# Patient Record
Sex: Female | Born: 2000 | Race: White | Hispanic: No | Marital: Single | Smoking: Never smoker
Health system: Southern US, Community
[De-identification: ages and names within clinical notes are randomized; demographics above are authoritative.]

## PROBLEM LIST (undated history)

## (undated) DIAGNOSIS — D649 Anemia, unspecified: Secondary | ICD-10-CM

## (undated) DIAGNOSIS — N390 Urinary tract infection, site not specified: Secondary | ICD-10-CM

## (undated) DIAGNOSIS — K3580 Unspecified acute appendicitis: Secondary | ICD-10-CM

## (undated) DIAGNOSIS — R569 Unspecified convulsions: Secondary | ICD-10-CM

## (undated) DIAGNOSIS — F909 Attention-deficit hyperactivity disorder, unspecified type: Secondary | ICD-10-CM

## (undated) DIAGNOSIS — F419 Anxiety disorder, unspecified: Secondary | ICD-10-CM

## (undated) DIAGNOSIS — F32A Depression, unspecified: Secondary | ICD-10-CM

## (undated) DIAGNOSIS — F329 Major depressive disorder, single episode, unspecified: Secondary | ICD-10-CM

## (undated) DIAGNOSIS — B2790 Infectious mononucleosis, unspecified without complication: Secondary | ICD-10-CM

## (undated) DIAGNOSIS — K529 Noninfective gastroenteritis and colitis, unspecified: Secondary | ICD-10-CM

## (undated) DIAGNOSIS — N83209 Unspecified ovarian cyst, unspecified side: Secondary | ICD-10-CM

## (undated) DIAGNOSIS — N200 Calculus of kidney: Secondary | ICD-10-CM

## (undated) HISTORY — PX: APPENDECTOMY: SHX54

## (undated) HISTORY — DX: Unspecified acute appendicitis: K35.80

---
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
## 2021-01-17 ENCOUNTER — Encounter (INDEPENDENT_AMBULATORY_CARE_PROVIDER_SITE_OTHER): Payer: Self-pay

## 2021-07-26 ENCOUNTER — Ambulatory Visit (INDEPENDENT_AMBULATORY_CARE_PROVIDER_SITE_OTHER): Payer: Medicaid Other | Admitting: Advanced Practice Midwife

## 2021-07-26 ENCOUNTER — Encounter: Payer: Self-pay | Admitting: Advanced Practice Midwife

## 2021-07-26 ENCOUNTER — Other Ambulatory Visit: Payer: Self-pay

## 2021-07-26 VITALS — BP 100/62 | Ht 63.0 in | Wt 136.0 lb

## 2021-07-26 DIAGNOSIS — N921 Excessive and frequent menstruation with irregular cycle: Secondary | ICD-10-CM | POA: Diagnosis not present

## 2021-07-26 MED ORDER — NORETHINDRONE-ETH ESTRADIOL 1-35 MG-MCG PO TABS: 1.0000 | ORAL_TABLET | Freq: Every day | ORAL | 11 refills | Status: DC

## 2021-07-26 NOTE — Patient Instructions (Signed)
Menorrhagia Menorrhagia is a form of abnormal uterine bleeding in which menstrual periods are heavy or last longer than normal. With menorrhagia, the periods may cause enough blood loss and cramping that a woman becomes unable to take part in herusual activities. What are the causes? Common causes of this condition include: Polyps or fibroids. These are noncancerous growths in the uterus. An imbalance of the hormones estrogen and progesterone. Anovulation, which occurs when one of the ovaries does not release an egg during one or more months. A problem with the thyroid gland (hypothyroidism). Side effects of having an intrauterine device (IUD). Side effects of some medicines, such as NSAIDs or blood thinners. A bleeding disorder that stops the blood from clotting normally. In some cases, the cause of this condition is not known. What increases the risk? You are more likely to develop this condition if you have cancer of the uterus. What are the signs or symptoms? Symptoms of this condition include: Routinely having to change your pad or tampon every 1-2 hours because it is soaked. Needing to use pads and tampons at the same time because of heavy bleeding. Needing to wake up to change your pads or tampons during the night. Passing blood clots larger than 1 inch (2.5 cm) in size. Having bleeding that lasts for more than 7 days. Having symptoms of low iron levels (anemia), such as tiredness (fatigue) or shortness of breath. How is this diagnosed? This condition may be diagnosed based on: A physical exam. Your symptoms and menstrual history. Tests, such as: Blood tests to check if you are pregnant or if you have hormonal changes, a bleeding or thyroid disorder, anemia, or other problems. Pap test to check for cancerous changes, infections, or inflammation. Endometrial biopsy. This test involves removing a tissue sample from the lining of the uterus (endometrium) to be examined under a  microscope. Pelvic ultrasound. This test uses sound waves to create images of your uterus, ovaries, and vagina. The images can show if you have fibroids or other growths. Hysteroscopy. For this test, a thin, flexible tube with a light on the end (hysteroscope) is used to look inside your uterus. How is this treated? Treatment may not be needed for this condition. If it is needed, the best treatment for you will depend on: Whether you need to prevent pregnancy. Your desire to have children in the future. The cause and severity of your bleeding. Your personal preference. Medicine Medicines are the first step in treatment. You may be treated with: Hormonal birth control methods. These treatments reduce bleeding during your menstrual period. They include: Birth control pills. Skin patch. Vaginal ring. Shots (injections) that you get every 3 months. Hormonal IUD. Implants that go under the skin. Medicines that thicken the blood and slow bleeding. Medicines that reduce swelling, such as ibuprofen. Medicines that contain an artificial (synthetic) hormone called progestin. Medicines that make the ovaries stop working for a short time. Iron supplements to treat anemia.  Surgery If medicines do not work, surgery may be done. Surgical options may include: Dilation and curettage (D&C). In this procedure, your health care provider opens the lowest part of the uterus (cervix) and then scrapes or suctions tissue from the endometrium. This reduces menstrual bleeding. Operative hysteroscopy. In this procedure, a hysteroscope is used to view your uterus and help remove polyps that may be causing heavy periods. Endometrial ablation. This is when various techniques are used to permanently destroy your entire endometrium. After endometrial ablation, most women have little or   no menstrual flow. This procedure reduces your ability to become pregnant. Endometrial resection. In this procedure, an electrosurgical  wire loop is used to remove the endometrium. This procedure reduces your ability to become pregnant. Hysterectomy. This is surgical removal of your uterus. This is a permanent procedure that stops menstrual periods. Pregnancy is not possible after a hysterectomy. Follow these instructions at home: Medicines Take over-the-counter and prescription medicines only as told by your health care provider. This includes iron pills. Do not change or switch medicines without asking your health care provider. Do not take aspirin or medicines that contain aspirin 1 week before or during your menstrual period. Aspirin may make bleeding worse. Managing constipation Your iron pills may cause constipation. If you are taking prescription iron supplements, you may need to take these actions to prevent or treat constipation: Drink enough fluid to keep your urine pale yellow. Take over-the-counter or prescription medicines. Eat foods that are high in fiber, such as beans, whole grains, and fresh fruits and vegetables. Limit foods that are high in fat and processed sugars, such as fried or sweet foods. General instructions If you need to change your sanitary pad or tampon more than once every 2 hours, limit your activity until the bleeding stops. Eat well-balanced meals, including foods that are high in iron. Foods that have a lot of iron include leafy green vegetables, meat, liver, eggs, and whole-grain breads and cereals. Do not try to lose weight until the abnormal bleeding has stopped and your blood iron level is back to normal. If you need to lose weight, work with your health care provider to lose weight safely. Keep all follow-up visits. This is important. Contact a health care provider if: You soak through a pad or tampon every 1 or 2 hours, and this happens every time you have a period. You need to use pads and tampons at the same time because you are bleeding so much. You have nausea, vomiting, diarrhea, or  other problems related to medicines you are taking. Get help right away if: You soak through more than a pad or tampon in 1 hour. You pass clots bigger than 1 inch (2.5 cm) wide. You feel short of breath. You feel like your heart is beating too fast. You feel dizzy or you faint. You feel very weak or tired. Summary Menorrhagia is a form of abnormal uterine bleeding in which menstrual periods are heavy or last longer than normal. Treatment may not be needed for this condition. If it is needed, it may include medicines or procedures. Take over-the-counter and prescription medicines only as told by your health care provider. This includes iron pills. Get help right away if you have heavy bleeding that soaks through more than a pad or tampon in 1 hour, you pass large clots, or you feel dizzy, short of breath, or very weak or tired. This information is not intended to replace advice given to you by your health care provider. Make sure you discuss any questions you have with your healthcare provider. Document Revised: 05/15/2020 Document Reviewed: 05/15/2020 Elsevier Patient Education  2022 Elsevier Inc.  

## 2021-07-26 NOTE — Progress Notes (Addendum)
Patient ID: Kayla Weber, female   DOB: Jul 15, 2001, 20 y.o.   MRN: [id number]  Reason for Consult: Menstrual Problem   Subjective:  HPI:  Kayla Weber is a 20 y.o. female being seen for re-occurring menorrhagia. She reports the addition of estrace last December helped keep her periods regular and moderate flow for a couple of months. After that they became irregular- every 2 months and again of heavy flow. She denies bleeding between cycles. Her most recent period is heavier than usual- changing a pad every hour, cramping, nausea and clots. She is sexually active and uses condoms always. She denies concern for STDs. She would like to start OCP at this time to help regulate her cycles. I also mentioned seeing MD if further follow up is needed for menorrhagia.   Past Medical History:  Diagnosis Date   ADHD    Anemia    Anxiety    Appendicitis, acute 10/11/15   Depression    Inflammatory bowel disease    Kidney stones    Mononucleosis 06/12/2017   Ovarian cyst 06/01/2017   Seizures ([place])    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-PTS GRANDMOTHER STATES PT ALMOST HAD ONE PRIOR TO LAST KIDNEY STONE SURGERY IN APRIL 2018   Urinary tract infection 05/2017   Family History  Problem Relation Age of Onset   Seizures Mother        As an adolescent and currently takes medication for seizures   Seizures Maternal Uncle        At the age of 3 but did not have many afterwards and never had to be placed on medication as a child or adult.   Past Surgical History:  Procedure Laterality Date   APPENDECTOMY     GANGLION CYST EXCISION Right 08/05/2017   Procedure: REMOVAL GANGLION OF WRIST;  Surgeon: Earnestine Leys, MD;  Location: [facility] ORS;  Service: Orthopedics;  Laterality: Right;   KIDNEY STONE SURGERY  01/08/2017   LAPAROSCOPIC APPENDECTOMY N/A 10/11/2015   Procedure: APPENDECTOMY LAPAROSCOPIC;  Surgeon: Florene Glen, MD;  Location: [facility] ORS;  Service: General;  Laterality: N/A;   OTHER SURGICAL HISTORY   2014   Stitches under her chin    TONSILLECTOMY  2013   [facility]    Short Social History:  Social History   Tobacco Use   Smoking status: Never   Smokeless tobacco: Never  Substance Use Topics   Alcohol use: No    Allergies  Allergen Reactions   Amoxicillin-Pot Clavulanate Hives, Itching and Swelling    "my fingers and face started to swell and I got really red and hot", rash appears on body    Ciprofloxacin Rash    Current Outpatient Medications  Medication Sig Dispense Refill   buPROPion (WELLBUTRIN XL) 150 MG 24 hr tablet Take by mouth.     Cetirizine HCl 10 MG CAPS Take by mouth.     FLUoxetine (PROZAC) 20 MG capsule Take 20 mg every morning by mouth.   2   gabapentin (NEURONTIN) 300 MG capsule TAKE 1 CAPSULE BY MOUTH EVERY DAY AT NIGHT     norethindrone-ethinyl estradiol 1/35 (ORTHO-NOVUM) tablet Take 1 tablet by mouth daily. 28 tablet 11   traZODone (DESYREL) 50 MG tablet Take by mouth.     lidocaine-prilocaine (EMLA) cream Apply 1 application topically as needed. 30 g 0   ondansetron (ZOFRAN-ODT) 4 MG disintegrating tablet Take 1 tablet (4 mg total) by mouth every 8 (eight) hours as needed for nausea or vomiting. [place]  tablet 0   triamcinolone (KENALOG) 0.1 % SMARTSIG:1 Application Topical 2-3 Times Daily     No current facility-administered medications for this visit.   Review of Systems  Constitutional:  Positive for malaise/fatigue. Negative for chills and fever.  HENT:  Negative for congestion, ear discharge, ear pain, hearing loss, sinus pain and sore throat.   Eyes:  Negative for blurred vision and double vision.  Respiratory:  Negative for cough, shortness of breath and wheezing.   Cardiovascular:  Negative for chest pain, palpitations and leg swelling.  Gastrointestinal:  Positive for diarrhea, nausea and vomiting. Negative for abdominal pain, blood in stool, constipation, heartburn and melena.  Genitourinary:  Negative for dysuria, flank pain, frequency, hematuria  and urgency.  Musculoskeletal:  Negative for back pain, joint pain and myalgias.  Skin:  Negative for itching and rash.  Neurological:  Positive for headaches. Negative for dizziness, tingling, tremors, sensory change, speech change, focal weakness, seizures, loss of consciousness and weakness.  Endo/Heme/Allergies:  Positive for environmental allergies. Does not bruise/bleed easily.       Positive for heavy menstrual bleeding  Psychiatric/Behavioral:  Positive for depression. Negative for hallucinations, memory loss, substance abuse and suicidal ideas. The patient is not nervous/anxious and does not have insomnia.        Positive for anxiety       Objective:  Objective   Vitals:   07/26/21 0813  BP: 100/62  Weight: 136 lb (61.7 kg)  Height: 5\' 3"  (1.6 m)   Body mass index is 24.09 kg/m. Constitutional: Well nourished, well developed female in no acute distress.  HEENT: normal Skin: Warm and dry.  Cardiovascular: Regular rate and rhythm.   Extremity:  no edema   Respiratory: Clear to auscultation bilateral. Normal respiratory effort Neuro: DTRs 2+, Cranial nerves grossly intact Psych: Alert and Oriented x3. No memory deficits. Normal mood and affect.  MS: normal gait and range of motion  A total of 16 minutes were spent primarily in consultation with the patient Assessment/Plan:     20 y.o. G0 P0 female with menorrhagia  Start Ortho-Novum, higher dose estrogen combined pill Follow up as needed for other hormonal treatment  Follow up with MD for other treatment modalities   12 CNM [facility] 07/26/2021, 10:43 AM

## 2021-09-11 ENCOUNTER — Ambulatory Visit: Payer: Medicaid Other | Admitting: Advanced Practice Midwife

## 2021-09-11 ENCOUNTER — Emergency Department: Payer: Medicaid Other

## 2021-09-11 ENCOUNTER — Emergency Department
Admission: EM | Admit: 2021-09-11 | Discharge: 2021-09-11 | Disposition: A | Discharge status: Home / Self Care | Payer: Medicaid Other | Attending: Emergency Medicine | Admitting: Emergency Medicine

## 2021-09-11 ENCOUNTER — Other Ambulatory Visit: Payer: Self-pay

## 2021-09-11 DIAGNOSIS — N200 Calculus of kidney: Secondary | ICD-10-CM | POA: Insufficient documentation

## 2021-09-11 DIAGNOSIS — R109 Unspecified abdominal pain: Secondary | ICD-10-CM

## 2021-09-11 DIAGNOSIS — Z79899 Other long term (current) drug therapy: Secondary | ICD-10-CM | POA: Diagnosis not present

## 2021-09-11 LAB — BASIC METABOLIC PANEL
Anion gap: 8 (ref 5–15)
BUN: 11 mg/dL (ref 6–20)
CO2: 25 mmol/L (ref 22–32)
Calcium: 9.4 mg/dL (ref 8.9–10.3)
Chloride: 102 mmol/L (ref 98–111)
Creatinine, Ser: 0.69 mg/dL (ref 0.44–1.00)
GFR, Estimated: >60 mL/min (ref >60)
Glucose, Bld: 93 mg/dL (ref 70–99)
Potassium: 4.3 mmol/L (ref 3.5–5.1)
Sodium: 135 mmol/L (ref 135–145)

## 2021-09-11 LAB — CBC
HCT: 40.1 % (ref 36.0–46.0)
Hemoglobin: 13.2 g/dL (ref 12.0–15.0)
MCH: 28.5 pg (ref 26.0–34.0)
MCHC: 32.9 g/dL (ref 30.0–36.0)
MCV: 86.6 fL (ref 80.0–100.0)
Platelets: 329 10*3/uL (ref 150–400)
RBC: 4.63 MIL/uL (ref 3.87–5.11)
RDW: 13.8 % (ref 11.5–15.5)
WBC: 10.1 10*3/uL (ref 4.0–10.5)
nRBC: 0 % (ref 0.0–0.2)

## 2021-09-11 LAB — URINALYSIS, ROUTINE W REFLEX MICROSCOPIC
Bilirubin Urine: NEGATIVE
Glucose, UA: NEGATIVE mg/dL
Ketones, ur: 5 mg/dL — AB
Leukocytes,Ua: NEGATIVE
Nitrite: NEGATIVE
Protein, ur: 30 mg/dL — AB
RBC / HPF: >50 RBC/hpf — ABNORMAL HIGH (ref 0–5)
Specific Gravity, Urine: 1.021 (ref 1.005–1.030)
pH: 6 (ref 5.0–8.0)

## 2021-09-11 LAB — POC URINE PREG, ED: Preg Test, Ur: NEGATIVE

## 2021-09-11 MED ORDER — OXYCODONE-ACETAMINOPHEN 5-325 MG PO TABS
1.0000 | ORAL_TABLET | Freq: Once | ORAL | Status: AC
  Administered 2021-09-11: 14:00:00 1 via ORAL
  Filled 2021-09-11: qty 1

## 2021-09-11 MED ORDER — ONDANSETRON 4 MG PO TBDP
4.0000 mg | ORAL_TABLET | Freq: Once | ORAL | Status: AC
  Administered 2021-09-11: 14:00:00 4 mg via ORAL
  Filled 2021-09-11: qty 1

## 2021-09-11 MED ORDER — LIDOCAINE-EPINEPHRINE-TETRACAINE (LET) TOPICAL GEL
3.0000 mL | Freq: Once | TOPICAL | Status: AC
  Administered 2021-09-11: 14:00:00 3 mL via TOPICAL
  Filled 2021-09-11: qty 3

## 2021-09-11 NOTE — ED Notes (Signed)
ED Provider at bedside. 

## 2021-09-11 NOTE — ED Notes (Signed)
Patient transported to CT 

## 2021-09-11 NOTE — ED Provider Notes (Signed)
°  Emergency Medicine Provider Triage Evaluation Note  Kayla Weber , a 20 y.o.female,  was evaluated in triage.  Pt complains of flank pain.  Patient states that it started this morning at work.  Endorses nausea/vomiting.  She has a history of multiple kidney stones in the past.  She states that this feels the same.   Review of Systems  Positive: Left-sided flank pain. Negative: Denies fever, chest pain, vomiting  Physical Exam   Vitals:   09/11/21 1350  BP: (!) 132/94  Pulse: 69  Resp: 18  Temp: 98.9 F (37.2 C)  SpO2: 100%   Gen:   Awake, visibly uncomfortable Resp:  Normal effort  MSK:   Moves extremities without difficulty  Other:    Medical Decision Making  Given the patient's initial medical screening exam, the following diagnostic evaluation has been ordered. The patient will be placed in the appropriate treatment space, once one is available, to complete the evaluation and treatment. I have discussed the plan of care with the patient and I have advised the patient that an ED physician or mid-level practitioner will reevaluate their condition after the test results have been received, as the results may give them additional insight into the type of treatment they may need.    Diagnostics: Labs, renal ultrasound, UA  Treatments: Percocet, ondansetron   Varney Daily, [place] 09/11/21 1401    Delton Prairie, MD 09/11/21 1726

## 2021-09-11 NOTE — ED Triage Notes (Signed)
Pt c/o left flank pain that started this morning with N/V , states she has a hx of kidney stones and this feels the same

## 2021-09-11 NOTE — ED Provider Notes (Signed)
[facility] Emergency Department Provider Note ____________________________________________   Event Date/Time   First MD Initiated Contact with Patient 09/11/21 1607     (approximate)  I have reviewed the triage vital signs and the nursing notes.  HISTORY  Chief Complaint Flank Pain   HPI Kayla Weber is a 20 y.o. femalewho presents to the ED for evaluation of left flank pain.  Chart review indicates hx kidney stones.   Pt presents to the ED for evaluation of left flank pain since early this morning.  She reports pain developing this morning after she got to work, waxing and waning throughout the morning, then worsening around lunchtime with severe 10/10 left flank pain.  She called her mother then and they came to the ED for evaluation.  She reports this feels like her kidney stones that she has had in the past.  She reports getting a Norco in triage and since her pain has resolved and feels fine right now.  Past Medical History:  Diagnosis Date   ADHD    Anemia    Anxiety    Appendicitis, acute 10/11/15   Depression    Inflammatory bowel disease    Kidney stones    Mononucleosis 06/12/2017   Ovarian cyst 06/01/2017   Seizures ([place])    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-PTS GRANDMOTHER STATES PT ALMOST HAD ONE PRIOR TO LAST KIDNEY STONE SURGERY IN APRIL 2018   Urinary tract infection 05/2017    Patient Active Problem List   Diagnosis Date Noted   Attention deficit hyperactivity disorder (ADHD), predominantly inattentive type 05/04/2019   Persistent depressive disorder 05/04/2019   Insomnia, psychophysiological 05/04/2019   Acute appendicitis 10/11/2015   Syncope and collapse 02/22/2014   Other convulsions 02/22/2014    Past Surgical History:  Procedure Laterality Date   APPENDECTOMY     GANGLION CYST EXCISION Right 08/05/2017   Procedure: REMOVAL GANGLION OF WRIST;  Surgeon: Earnestine Leys, MD;  Location: [facility] ORS;  Service: Orthopedics;   Laterality: Right;   KIDNEY STONE SURGERY  01/08/2017   LAPAROSCOPIC APPENDECTOMY N/A 10/11/2015   Procedure: APPENDECTOMY LAPAROSCOPIC;  Surgeon: Florene Glen, MD;  Location: [facility] ORS;  Service: General;  Laterality: N/A;   OTHER SURGICAL HISTORY  2014   Stitches under her chin    TONSILLECTOMY  2013   [facility]    Prior to Admission medications   Medication Sig Start Date End Date Taking? Authorizing Provider  buPROPion (WELLBUTRIN XL) 150 MG 24 hr tablet Take by mouth. 04/18/20   [provider]  Cetirizine HCl 10 MG CAPS Take by mouth.    [provider]  FLUoxetine (PROZAC) 20 MG capsule Take 20 mg every morning by mouth.  10/30/16   [provider]  gabapentin (NEURONTIN) 300 MG capsule TAKE 1 CAPSULE BY MOUTH EVERY DAY AT NIGHT 07/17/20   [provider]  lidocaine-prilocaine (EMLA) cream Apply 1 application topically as needed. 08/17/20   Rod Can, CNM  norethindrone-ethinyl estradiol 1/35 (ORTHO-NOVUM) tablet Take 1 tablet by mouth daily. 07/26/21   Rod Can, CNM  ondansetron (ZOFRAN-ODT) 4 MG disintegrating tablet Take 1 tablet (4 mg total) by mouth every 8 (eight) hours as needed for nausea or vomiting. 09/15/20   Triplett, Johnette Abraham B, FNP  traZODone (DESYREL) 50 MG tablet Take by mouth.    [provider]  triamcinolone (KENALOG) 0.1 % SMARTSIG:1 Application Topical 2-3 Times Daily 07/18/20   [provider]    Allergies Amoxicillin-pot clavulanate and Ciprofloxacin  Family History  Problem Relation Age of Onset   Seizures Mother        As an adolescent and currently takes medication for seizures   Seizures Maternal Uncle        At the age of 3 but did not have many afterwards and never had to be placed on medication as a child or adult.    Social History Social History   Tobacco Use   Smoking status: Never   Smokeless tobacco: Never  Vaping Use   Vaping Use: Never used  Substance Use Topics   Alcohol use: No    Drug use: No    Review of Systems  Constitutional: No fever/chills Eyes: No visual changes. ENT: No sore throat. Cardiovascular: Denies chest pain. Respiratory: Denies shortness of breath. Gastrointestinal: Positive for atraumatic left flank pain.  No diarrhea.  No constipation. Genitourinary: Negative for dysuria. Musculoskeletal: Negative for back pain. Skin: Negative for rash. Neurological: Negative for headaches, focal weakness or numbness.  ____________________________________________   PHYSICAL EXAM:  VITAL SIGNS: Vitals:   09/11/21 1350 09/11/21 1744  BP: (!) 132/94 104/63  Pulse: 69 90  Resp: 18 16  Temp: 98.9 F (37.2 C) 98.3 F (36.8 C)  SpO2: 100% 99%     Constitutional: Alert and oriented. Well appearing and in no acute distress. Eyes: Conjunctivae are normal. PERRL. EOMI. Head: Atraumatic. Nose: No congestion/rhinnorhea. Mouth/Throat: Mucous membranes are moist.  Oropharynx non-erythematous. Neck: No stridor. No cervical spine tenderness to palpation. Cardiovascular: Normal rate, regular rhythm. Good peripheral circulation. Respiratory: Normal respiratory effort.  No retractions. Lungs CTAB. Gastrointestinal: Soft , nondistended, nontender to palpation.  Mild left-sided CVA tenderness without overlying skin changes. Benign frontal abdomen. Musculoskeletal: No joint effusions. No signs of acute trauma. Neurologic:  Normal speech and language. No gross focal neurologic deficits are appreciated. No gait instability noted. Skin:  Skin is warm, dry and intact. No rash noted. Psychiatric: Mood and affect are normal. Speech and behavior are normal.  ____________________________________________   LABS (all labs ordered are listed, but only abnormal results are displayed)  Labs Reviewed  URINALYSIS, ROUTINE W REFLEX MICROSCOPIC - Abnormal; Notable for the following components:      Result Value   Color, Urine YELLOW (*)    APPearance CLOUDY (*)    Hgb  urine dipstick LARGE (*)    Ketones, ur 5 (*)    Protein, ur 30 (*)    RBC / HPF >50 (*)    Bacteria, UA RARE (*)    All other components within normal limits  POC URINE PREG, ED - Normal  BASIC METABOLIC PANEL  CBC   ____________________________________________  12 Lead EKG   ____________________________________________  RADIOLOGY  ED MD interpretation:    Official radiology report(s): US Renal  Result Date: 09/11/2021 CLINICAL DATA:  Left nephrolithiasis EXAM: RENAL / URINARY TRACT ULTRASOUND COMPLETE COMPARISON:  09/17/2019 and previous FINDINGS: Right Kidney: Renal measurements: 11 x 4.1 x 4.1 cm = volume: 96 mL. Parenchyma is isoechoic to adjacent liver. No hydronephrosis. 1.2 cm shadowing calculus centrally in the renal collecting system. Left Kidney: Renal measurements: 10.6 x 4.9 x 4.5 cm = volume: 123 mL. Normal parenchymal echogenicity. 4 mm probable calcification in the mid renal collecting system. Mild pelvicaliectasis. Bladder: Incompletely distended, unremarkable. Other: None. IMPRESSION: Bilateral nephrolithiasis without hydronephrosis. Electronically Signed   By: Corlis Leak M.D.   On: 09/11/2021 15:04   CT Renal Stone Study  Result Date: 09/11/2021 CLINICAL DATA:  Left flank pain EXAM:  CT ABDOMEN AND PELVIS WITHOUT CONTRAST TECHNIQUE: Multidetector CT imaging of the abdomen and pelvis was performed following the standard protocol without IV contrast. COMPARISON:  01/03/2017 FINDINGS: Lower chest: Unremarkable. Hepatobiliary: Unremarkable. Pancreas: No focal abnormality is seen. Spleen: Unremarkable. Adrenals/Urinary Tract: Adrenals are not enlarged. There is prominence of right renal pelvis measuring 2.1 cm in AP diameter. There is no dilation of minor calices. There is 8 mm calculus in the right renal pelvis. There is no hydronephrosis in the left kidney. There are small bilateral renal stones. There is no significant dilation of the ureters. In the image 67 of series  2, there is new 3 mm calcific density in the distal course of left ureter. There is no definite demonstrable dilated ureter adjacent to this calcific density. Urinary bladder is unremarkable. Stomach/Bowel: Stomach is not distended. Small bowel loops are not dilated. Appendix is not seen. There is no pericecal inflammation. There is no significant wall thickening in colon. There is no pericolic stranding or fluid collection. Vascular/Lymphatic: There are slightly enlarged mesenteric lymph nodes. Vascular structures are unremarkable. Reproductive: Unremarkable. Other: There is no ascites or pneumoperitoneum. Small umbilical hernia containing fat is seen. There are enlarged lymph nodes in both inguinal regions largest measuring 1.3 x 1.2 cm. There is interval increase in size of inguinal lymph nodes. Musculoskeletal: Bulging of annulus is noted at L4-L5 level. IMPRESSION: There is no evidence of intestinal obstruction or pneumoperitoneum. There is prominence of right renal pelvis without dilation of minor calices. There is 8 mm calculus in the right renal pelvis. Possibility of intermittent right ureteropelvic junction obstruction by this calculus is not excluded. There is no hydronephrosis in the left kidney. There is a new 3 mm calcific density in the distal course of left ureter close to the ureterovesical junction. This may either suggest calculus in the distal ureter without significant obstruction or interval appearance of phlebolith. There are few small bilateral renal stones. There are slightly enlarged lymph nodes in the mesentery and inguinal regions with interval increase in size. This may suggest benign reactive hyperplasia. Less likely possibility would be active inflammatory or neoplastic process. Clinical observation and short-term follow-up CT in 2-3 months may be considered. Electronically Signed   By: Elmer Picker M.D.   On: 09/11/2021 17:35     ____________________________________________   PROCEDURES and INTERVENTIONS  Procedure(s) performed (including Critical Care):  Procedures  Medications  lidocaine-EPINEPHrine-tetracaine (LET) topical gel (3 mLs Topical Given 09/11/21 1407)  oxyCODONE-acetaminophen (PERCOCET/ROXICET) 5-325 MG per tablet 1 tablet (1 tablet Oral Given 09/11/21 1405)  ondansetron (ZOFRAN-ODT) disintegrating tablet 4 mg (4 mg Oral Given 09/11/21 1405)    ____________________________________________   MDM / ED COURSE   20 year old girl with history of kidney stones presents to the ED with resolving pain, likely due to a recently passed stone, and suitable for outpatient management.  She looks clinically well and has minimal pain when I see her.  UA with hematuria without infectious features and blood work is benign.  Renal function intact.  Imaging with evidence of intrarenal nephrolithiasis, but no ureterolithiasis or signs of ureteral obstruction.  No other signs of intra-abdominal pathology acutely.  We discussed nonspecific lymphadenopathy.  She is asymptomatic in the ED and suitable for outpatient management with urology follow-up.  Clinical Course as of 09/11/21 1905  Wed Sep 11, 2021  1746  Reassessed.  Remains pain-free.  We discussed work-up without evidence of obstructing ureterolithiasis.  We discussed nonspecific mild lymphadenopathy.  We discussed following up with  adult urology now that she is old enough and return precautions for the ED. [DS]    Clinical Course User Index [DS] Vladimir Crofts, MD    ____________________________________________   FINAL CLINICAL IMPRESSION(S) / ED DIAGNOSES  Final diagnoses:  Left flank pain  Nephrolithiasis     ED Discharge Orders     None        Alyssabeth Bruster Tamala Julian   Note:  This document was prepared using Dragon voice recognition software and may include unintentional dictation errors.    Vladimir Crofts, MD 09/11/21 [phone]

## 2021-09-12 ENCOUNTER — Encounter: Payer: Self-pay | Admitting: *Deleted

## 2021-09-12 ENCOUNTER — Emergency Department
Admission: EM | Admit: 2021-09-12 | Discharge: 2021-09-12 | Disposition: A | Discharge status: Home / Self Care | Payer: Medicaid Other | Attending: Emergency Medicine | Admitting: Emergency Medicine

## 2021-09-12 ENCOUNTER — Other Ambulatory Visit: Payer: Self-pay

## 2021-09-12 DIAGNOSIS — N200 Calculus of kidney: Secondary | ICD-10-CM | POA: Insufficient documentation

## 2021-09-12 DIAGNOSIS — R109 Unspecified abdominal pain: Secondary | ICD-10-CM

## 2021-09-12 MED ORDER — OXYCODONE HCL 5 MG PO TABS: 5.0000 mg | ORAL_TABLET | Freq: Four times a day (QID) | ORAL | 0 refills | Status: DC | PRN

## 2021-09-12 MED ORDER — OXYCODONE-ACETAMINOPHEN 5-325 MG PO TABS
1.0000 | ORAL_TABLET | Freq: Once | ORAL | Status: AC
  Administered 2021-09-12: 21:00:00 1 via ORAL
  Filled 2021-09-12: qty 1

## 2021-09-12 NOTE — ED Provider Notes (Signed)
[facility] EMERGENCY DEPARTMENT Provider Note   CSN: [id number] Arrival date & time: 09/12/21  1912     History Chief Complaint  Patient presents with   Flank Pain    Kayla Weber is a 20 y.o. female.  Presents to the emergency department evaluation of left flank pain.  Diagnosed with left 3 mm kidney stone yesterday.  Was doing well at discharge, pain well controlled.  Today developed increased pain left lower flank.  No fevers nausea vomiting or painful urination.  No hematuria.  Patient has a history of numerous kidney stones.  Patient requesting pain medication.  Pain feels the same as it did yesterday in regards to location and intensity.  HPI     Past Medical History:  Diagnosis Date   ADHD    Anemia    Anxiety    Appendicitis, acute 10/11/15   Depression    Inflammatory bowel disease    Kidney stones    Mononucleosis 06/12/2017   Ovarian cyst 06/01/2017   Seizures ([place])    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-PTS GRANDMOTHER STATES PT ALMOST HAD ONE PRIOR TO LAST KIDNEY STONE SURGERY IN APRIL 2018   Urinary tract infection 05/2017    Patient Active Problem List   Diagnosis Date Noted   Attention deficit hyperactivity disorder (ADHD), predominantly inattentive type 05/04/2019   Persistent depressive disorder 05/04/2019   Insomnia, psychophysiological 05/04/2019   Acute appendicitis 10/11/2015   Syncope and collapse 02/22/2014   Other convulsions 02/22/2014    Past Surgical History:  Procedure Laterality Date   APPENDECTOMY     GANGLION CYST EXCISION Right 08/05/2017   Procedure: REMOVAL GANGLION OF WRIST;  Surgeon: Earnestine Leys, MD;  Location: [facility] ORS;  Service: Orthopedics;  Laterality: Right;   KIDNEY STONE SURGERY  01/08/2017   LAPAROSCOPIC APPENDECTOMY N/A 10/11/2015   Procedure: APPENDECTOMY LAPAROSCOPIC;  Surgeon: Florene Glen, MD;  Location: [facility] ORS;  Service: General;  Laterality: N/A;   OTHER SURGICAL HISTORY  2014   Stitches  under her chin    TONSILLECTOMY  2013   [facility]     OB History     Gravida  0   Para  0   Term  0   Preterm  0   AB  0   Living  0      SAB  0   IAB  0   Ectopic  0   Multiple  0   Live Births  0           Family History  Problem Relation Age of Onset   Seizures Mother        As an adolescent and currently takes medication for seizures   Seizures Maternal Uncle        At the age of 3 but did not have many afterwards and never had to be placed on medication as a child or adult.    Social History   Tobacco Use   Smoking status: Never   Smokeless tobacco: Never  Vaping Use   Vaping Use: Never used  Substance Use Topics   Alcohol use: No   Drug use: No    Home Medications Prior to Admission medications   Medication Sig Start Date End Date Taking? Authorizing Provider  oxyCODONE (ROXICODONE) 5 MG immediate release tablet Take 1 tablet (5 mg total) by mouth every 6 (six) hours as needed. 09/12/21 09/12/22 Yes Duanne Guess, PA-C  buPROPion (WELLBUTRIN XL) 150 MG 24 hr tablet Take  by mouth. 04/18/20   [provider]  Cetirizine HCl 10 MG CAPS Take by mouth.    [provider]  FLUoxetine (PROZAC) 20 MG capsule Take 20 mg every morning by mouth.  10/30/16   [provider]  gabapentin (NEURONTIN) 300 MG capsule TAKE 1 CAPSULE BY MOUTH EVERY DAY AT NIGHT 07/17/20   [provider]  lidocaine-prilocaine (EMLA) cream Apply 1 application topically as needed. 08/17/20   Rod Can, CNM  norethindrone-ethinyl estradiol 1/35 (ORTHO-NOVUM) tablet Take 1 tablet by mouth daily. 07/26/21   Rod Can, CNM  ondansetron (ZOFRAN-ODT) 4 MG disintegrating tablet Take 1 tablet (4 mg total) by mouth every 8 (eight) hours as needed for nausea or vomiting. 09/15/20   Triplett, Johnette Abraham B, FNP  traZODone (DESYREL) 50 MG tablet Take by mouth.    [provider]  triamcinolone (KENALOG) 0.1 % SMARTSIG:1 Application Topical 2-3 Times Daily  07/18/20   [provider]    Allergies    Amoxicillin-pot clavulanate and Ciprofloxacin  Review of Systems   Review of Systems  Constitutional:  Negative for chills and fever.  Gastrointestinal:  Negative for abdominal pain, nausea and vomiting.  Genitourinary:  Positive for flank pain. Negative for dysuria.  Musculoskeletal:  Positive for back pain. Negative for myalgias and neck pain.  Skin:  Negative for rash and wound.  Neurological:  Negative for dizziness, light-headedness and headaches.   Physical Exam Updated Vital Signs BP (!) 124/94 (BP Location: Right Arm)    Pulse 100    Temp 98.3 F (36.8 C) (Oral)    Resp 18    Ht 5\' 3"  (1.6 m)    Wt 60.8 kg    LMP  (LMP Unknown)    SpO2 97%    BMI 23.74 kg/m   Physical Exam Constitutional:      Appearance: She is well-developed.  HENT:     Head: Normocephalic and atraumatic.  Eyes:     Conjunctiva/sclera: Conjunctivae normal.  Cardiovascular:     Rate and Rhythm: Normal rate.  Pulmonary:     Effort: Pulmonary effort is normal. No respiratory distress.  Abdominal:     General: There is no distension.     Palpations: Abdomen is soft.     Tenderness: There is no abdominal tenderness. There is left CVA tenderness. There is no guarding.  Musculoskeletal:        General: Normal range of motion.     Cervical back: Normal range of motion.  Skin:    General: Skin is warm.     Findings: No rash.  Neurological:     Mental Status: She is alert and oriented to person, place, and time.  Psychiatric:        Behavior: Behavior normal.        Thought Content: Thought content normal.    ED Results / Procedures / Treatments   Labs (all labs ordered are listed, but only abnormal results are displayed) Labs Reviewed - No data to display  EKG None  Radiology US Renal  Result Date: 09/11/2021 CLINICAL DATA:  Left nephrolithiasis EXAM: RENAL / URINARY TRACT ULTRASOUND COMPLETE COMPARISON:  09/17/2019 and previous FINDINGS:  Right Kidney: Renal measurements: 11 x 4.1 x 4.1 cm = volume: 96 mL. Parenchyma is isoechoic to adjacent liver. No hydronephrosis. 1.2 cm shadowing calculus centrally in the renal collecting system. Left Kidney: Renal measurements: 10.6 x 4.9 x 4.5 cm = volume: 123 mL. Normal parenchymal echogenicity. 4 mm probable calcification in the mid  renal collecting system. Mild pelvicaliectasis. Bladder: Incompletely distended, unremarkable. Other: None. IMPRESSION: Bilateral nephrolithiasis without hydronephrosis. Electronically Signed   By: Lucrezia Europe M.D.   On: 09/11/2021 15:04   CT Renal Stone Study  Result Date: 09/11/2021 CLINICAL DATA:  Left flank pain EXAM: CT ABDOMEN AND PELVIS WITHOUT CONTRAST TECHNIQUE: Multidetector CT imaging of the abdomen and pelvis was performed following the standard protocol without IV contrast. COMPARISON:  01/03/2017 FINDINGS: Lower chest: Unremarkable. Hepatobiliary: Unremarkable. Pancreas: No focal abnormality is seen. Spleen: Unremarkable. Adrenals/Urinary Tract: Adrenals are not enlarged. There is prominence of right renal pelvis measuring 2.1 cm in AP diameter. There is no dilation of minor calices. There is 8 mm calculus in the right renal pelvis. There is no hydronephrosis in the left kidney. There are small bilateral renal stones. There is no significant dilation of the ureters. In the image 67 of series 2, there is new 3 mm calcific density in the distal course of left ureter. There is no definite demonstrable dilated ureter adjacent to this calcific density. Urinary bladder is unremarkable. Stomach/Bowel: Stomach is not distended. Small bowel loops are not dilated. Appendix is not seen. There is no pericecal inflammation. There is no significant wall thickening in colon. There is no pericolic stranding or fluid collection. Vascular/Lymphatic: There are slightly enlarged mesenteric lymph nodes. Vascular structures are unremarkable. Reproductive: Unremarkable. Other: There is  no ascites or pneumoperitoneum. Small umbilical hernia containing fat is seen. There are enlarged lymph nodes in both inguinal regions largest measuring 1.3 x 1.2 cm. There is interval increase in size of inguinal lymph nodes. Musculoskeletal: Bulging of annulus is noted at L4-L5 level. IMPRESSION: There is no evidence of intestinal obstruction or pneumoperitoneum. There is prominence of right renal pelvis without dilation of minor calices. There is 8 mm calculus in the right renal pelvis. Possibility of intermittent right ureteropelvic junction obstruction by this calculus is not excluded. There is no hydronephrosis in the left kidney. There is a new 3 mm calcific density in the distal course of left ureter close to the ureterovesical junction. This may either suggest calculus in the distal ureter without significant obstruction or interval appearance of phlebolith. There are few small bilateral renal stones. There are slightly enlarged lymph nodes in the mesentery and inguinal regions with interval increase in size. This may suggest benign reactive hyperplasia. Less likely possibility would be active inflammatory or neoplastic process. Clinical observation and short-term follow-up CT in 2-3 months may be considered. Electronically Signed   By: Elmer Picker M.D.   On: 09/11/2021 17:35    Procedures Procedures   Medications Ordered in ED Medications  oxyCODONE-acetaminophen (PERCOCET/ROXICET) 5-325 MG per tablet 1 tablet (1 tablet Oral Given 09/12/21 2043)    ED Course  I have reviewed the triage vital signs and the nursing notes.  Pertinent labs & imaging results that were available during my care of the patient were reviewed by me and considered in my medical decision making (see chart for details).    MDM Rules/Calculators/A&P                         20 year old female with left flank pain.  CT reviewed showing 3 mm ureteral stone yesterday.  Urinalysis reviewed showing no signs of any  infection.  Today vital signs are stable, afebrile.  Pain controlled with oxycodone.  She is given prescription for medication to take at home and she will call urology to schedule follow-up appointment.  She  will continue with increased fluids.  She is tolerating p.o. well.  She understands signs symptoms return to the ER for such as any fevers increasing pain nausea vomiting or any urgent changes in health.  Final Clinical Impression(s) / ED Diagnoses Final diagnoses:  Left flank pain  Kidney stone    Rx / DC Orders ED Discharge Orders          Ordered    oxyCODONE (ROXICODONE) 5 MG immediate release tablet  Every 6 hours PRN        09/12/21 2042             Ronnette Juniper 09/12/21 2049    Chesley Noon, MD 09/12/21 [phone]

## 2021-09-12 NOTE — Discharge Instructions (Signed)
Please make sure you are drinking lots of fluids.  Take pain medication as prescribed as needed for pain.  Return to the ER for any fevers increased pain worsening symptoms or urgent changes in her health.  Call urologist to schedule follow-up appointment.

## 2021-09-12 NOTE — ED Triage Notes (Addendum)
Pt has left flank pain .  pt was seen here yesterday for similar sx.   Pt states she is not feeling better.   Pt did not get any rx meds for nausea or pain .  Pt alert  speech clear.

## 2021-09-13 ENCOUNTER — Emergency Department: Payer: Medicaid Other

## 2021-09-13 ENCOUNTER — Emergency Department
Admission: EM | Admit: 2021-09-13 | Discharge: 2021-09-13 | Disposition: A | Discharge status: Home / Self Care | Payer: Medicaid Other | Attending: Emergency Medicine | Admitting: Emergency Medicine

## 2021-09-13 ENCOUNTER — Other Ambulatory Visit: Payer: Self-pay

## 2021-09-13 ENCOUNTER — Encounter: Payer: Self-pay | Admitting: Emergency Medicine

## 2021-09-13 DIAGNOSIS — M542 Cervicalgia: Secondary | ICD-10-CM | POA: Insufficient documentation

## 2021-09-13 DIAGNOSIS — M545 Low back pain, unspecified: Secondary | ICD-10-CM | POA: Diagnosis present

## 2021-09-13 DIAGNOSIS — N200 Calculus of kidney: Secondary | ICD-10-CM | POA: Diagnosis not present

## 2021-09-13 DIAGNOSIS — N12 Tubulo-interstitial nephritis, not specified as acute or chronic: Secondary | ICD-10-CM | POA: Insufficient documentation

## 2021-09-13 LAB — URINALYSIS, ROUTINE W REFLEX MICROSCOPIC
Bacteria, UA: NONE SEEN
Bilirubin Urine: NEGATIVE
Glucose, UA: NEGATIVE mg/dL
Ketones, ur: NEGATIVE mg/dL
Leukocytes,Ua: NEGATIVE
Nitrite: NEGATIVE
Protein, ur: 100 mg/dL — AB
RBC / HPF: >50 RBC/hpf — ABNORMAL HIGH (ref 0–5)
Specific Gravity, Urine: 1.015 (ref 1.005–1.030)
pH: 8 (ref 5.0–8.0)

## 2021-09-13 LAB — BASIC METABOLIC PANEL
Anion gap: 9 (ref 5–15)
BUN: 11 mg/dL (ref 6–20)
CO2: 27 mmol/L (ref 22–32)
Calcium: 9.4 mg/dL (ref 8.9–10.3)
Chloride: 102 mmol/L (ref 98–111)
Creatinine, Ser: 0.62 mg/dL (ref 0.44–1.00)
GFR, Estimated: >60 mL/min (ref >60)
Glucose, Bld: 106 mg/dL — ABNORMAL HIGH (ref 70–99)
Potassium: 4 mmol/L (ref 3.5–5.1)
Sodium: 138 mmol/L (ref 135–145)

## 2021-09-13 LAB — CBC WITH DIFFERENTIAL/PLATELET
Abs Immature Granulocytes: 0.06 10*3/uL (ref 0.00–0.07)
Basophils Absolute: 0.1 10*3/uL (ref 0.0–0.1)
Basophils Relative: 0 %
Eosinophils Absolute: 0 10*3/uL (ref 0.0–0.5)
Eosinophils Relative: 0 %
HCT: 39.6 % (ref 36.0–46.0)
Hemoglobin: 13.2 g/dL (ref 12.0–15.0)
Immature Granulocytes: 0 %
Lymphocytes Relative: 11 %
Lymphs Abs: 1.6 10*3/uL (ref 0.7–4.0)
MCH: 28.8 pg (ref 26.0–34.0)
MCHC: 33.3 g/dL (ref 30.0–36.0)
MCV: 86.5 fL (ref 80.0–100.0)
Monocytes Absolute: 0.4 10*3/uL (ref 0.1–1.0)
Monocytes Relative: 3 %
Neutro Abs: 12.2 10*3/uL — ABNORMAL HIGH (ref 1.7–7.7)
Neutrophils Relative %: 86 %
Platelets: 355 10*3/uL (ref 150–400)
RBC: 4.58 MIL/uL (ref 3.87–5.11)
RDW: 13.7 % (ref 11.5–15.5)
WBC: 14.4 10*3/uL — ABNORMAL HIGH (ref 4.0–10.5)
nRBC: 0 % (ref 0.0–0.2)

## 2021-09-13 LAB — POC URINE PREG, ED: Preg Test, Ur: NEGATIVE

## 2021-09-13 MED ORDER — OXYCODONE-ACETAMINOPHEN 5-325 MG PO TABS
1.0000 | ORAL_TABLET | Freq: Once | ORAL | Status: AC
  Administered 2021-09-13: 10:00:00 1 via ORAL
  Filled 2021-09-13: qty 1

## 2021-09-13 MED ORDER — SULFAMETHOXAZOLE-TRIMETHOPRIM 800-160 MG PO TABS: 1.0000 | ORAL_TABLET | Freq: Two times a day (BID) | ORAL | 0 refills | Status: AC

## 2021-09-13 MED ORDER — ONDANSETRON 4 MG PO TBDP: 4.0000 mg | ORAL_TABLET | Freq: Three times a day (TID) | ORAL | 0 refills | Status: DC | PRN

## 2021-09-13 MED ORDER — OXYCODONE-ACETAMINOPHEN 5-325 MG PO TABS
1.0000 | ORAL_TABLET | Freq: Once | ORAL | Status: AC
  Administered 2021-09-13: 03:00:00 1 via ORAL
  Filled 2021-09-13: qty 1

## 2021-09-13 MED ORDER — IBUPROFEN 400 MG PO TABS
400.0000 mg | ORAL_TABLET | Freq: Once | ORAL | Status: AC
  Administered 2021-09-13: 10:00:00 400 mg via ORAL
  Filled 2021-09-13: qty 1

## 2021-09-13 MED ORDER — OXYCODONE-ACETAMINOPHEN 5-325 MG PO TABS: 1.0000 | ORAL_TABLET | Freq: Three times a day (TID) | ORAL | 0 refills | Status: AC | PRN

## 2021-09-13 MED ORDER — ONDANSETRON 4 MG PO TBDP
4.0000 mg | ORAL_TABLET | Freq: Once | ORAL | Status: AC
  Administered 2021-09-13: 03:00:00 4 mg via ORAL
  Filled 2021-09-13: qty 1

## 2021-09-13 NOTE — ED Triage Notes (Signed)
Pt to triage via w/c, seen and tx for kidney stone earlier; received percocet with relief; pain returned before pt was able to get med filled at pharmacy

## 2021-09-13 NOTE — ED Notes (Signed)
See triage note  presents with flank pain  states she was seen and dx'd with renal stone on weds  states she then had to return for pian control yesterday  unable to get rx' b/c drug store was closed  states pain is now to both flank areas with some nausea

## 2021-09-13 NOTE — ED Provider Notes (Signed)
[facility] Emergency Department Provider Note  ____________________________________________   Event Date/Time   First MD Initiated Contact with Patient 09/13/21 [phone]     (approximate)  I have reviewed the triage vital signs and the nursing notes.   HISTORY  Chief Complaint Flank Pain   HPI Kayla Weber is a 20 y.o. female with below noted past medical history as well as recent ED evaluation and diagnosis of right-sided kidney stone on 12/28 who presents for assessment of some persistent right-sided neck pain as well as some new left-sided low back pain and very dark urine with slight burning with urination.  Patient states she was prescribed some pain medicine but her pharmacy was closed and she was not able to your prescription.  She states she try to go to work yesterday but had to leave early due to the severe pain.  She endorses some nausea but no significant vomiting, diarrhea, vaginal bleeding or discharge, upper back pain, chest pain, cough, shortness of breath, fevers or other acute associated sick symptoms.  No other acute concerns at this time.  She has been taking some old nausea medicines for the nausea that she had from remote illness.         Past Medical History:  Diagnosis Date   ADHD    Anemia    Anxiety    Appendicitis, acute 10/11/15   Depression    Inflammatory bowel disease    Kidney stones    Mononucleosis 06/12/2017   Ovarian cyst 06/01/2017   Seizures (HCC)    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-PTS GRANDMOTHER STATES PT ALMOST HAD ONE PRIOR TO LAST KIDNEY STONE SURGERY IN APRIL 2018   Urinary tract infection 05/2017    Patient Active Problem List   Diagnosis Date Noted   Attention deficit hyperactivity disorder (ADHD), predominantly inattentive type 05/04/2019   Persistent depressive disorder 05/04/2019   Insomnia, psychophysiological 05/04/2019   Acute appendicitis 10/11/2015   Syncope and collapse 02/22/2014   Other  convulsions 02/22/2014    Past Surgical History:  Procedure Laterality Date   APPENDECTOMY     GANGLION CYST EXCISION Right 08/05/2017   Procedure: REMOVAL GANGLION OF WRIST;  Surgeon: Deeann SaintMiller, Howard, MD;  Location: [facility] ORS;  Service: Orthopedics;  Laterality: Right;   KIDNEY STONE SURGERY  01/08/2017   LAPAROSCOPIC APPENDECTOMY N/A 10/11/2015   Procedure: APPENDECTOMY LAPAROSCOPIC;  Surgeon: Lattie Hawichard E Cooper, MD;  Location: [facility] ORS;  Service: General;  Laterality: N/A;   OTHER SURGICAL HISTORY  2014   Stitches under her chin    TONSILLECTOMY  2013   [facility]    Prior to Admission medications   Medication Sig Start Date End Date Taking? Authorizing Provider  sulfamethoxazole-trimethoprim (BACTRIM DS) 800-160 MG tablet Take 1 tablet by mouth 2 (two) times daily for 10 days. 09/13/21 09/23/21 Yes Gilles ChiquitoSmith, Charl Wellen P, MD  buPROPion (WELLBUTRIN XL) 150 MG 24 hr tablet Take by mouth. 04/18/20   [provider]  Cetirizine HCl 10 MG CAPS Take by mouth.    [provider]  FLUoxetine (PROZAC) 20 MG capsule Take 20 mg every morning by mouth.  10/30/16   [provider]  gabapentin (NEURONTIN) 300 MG capsule TAKE 1 CAPSULE BY MOUTH EVERY DAY AT NIGHT 07/17/20   [provider]  lidocaine-prilocaine (EMLA) cream Apply 1 application topically as needed. 08/17/20   Tresea MallGledhill, Jane, CNM  norethindrone-ethinyl estradiol 1/35 (ORTHO-NOVUM) tablet Take 1 tablet by mouth daily. 07/26/21   Tresea MallGledhill, Jane, CNM  ondansetron (ZOFRAN-ODT)  4 MG disintegrating tablet Take 1 tablet (4 mg total) by mouth every 8 (eight) hours as needed for nausea or vomiting. 09/15/20   Triplett, Cari B, FNP  oxyCODONE (ROXICODONE) 5 MG immediate release tablet Take 1 tablet (5 mg total) by mouth every 6 (six) hours as needed. 09/12/21 09/12/22  Evon Slack, PA-C  traZODone (DESYREL) 50 MG tablet Take by mouth.    [provider]  triamcinolone (KENALOG) 0.1 % SMARTSIG:1 Application Topical 2-3  Times Daily 07/18/20   [provider]    Allergies Amoxicillin-pot clavulanate and Ciprofloxacin  Family History  Problem Relation Age of Onset   Seizures Mother        As an adolescent and currently takes medication for seizures   Seizures Maternal Uncle        At the age of 3 but did not have many afterwards and never had to be placed on medication as a child or adult.    Social History Social History   Tobacco Use   Smoking status: Never   Smokeless tobacco: Never  Vaping Use   Vaping Use: Never used  Substance Use Topics   Alcohol use: No   Drug use: No    Review of Systems  Review of Systems  Constitutional:  Negative for chills and fever.  HENT:  Negative for sore throat.   Eyes:  Negative for pain.  Respiratory:  Negative for cough and stridor.   Cardiovascular:  Negative for chest pain.  Gastrointestinal:  Positive for nausea. Negative for vomiting.  Genitourinary:  Positive for dysuria, flank pain and hematuria.  Musculoskeletal:  Positive for back pain.  Skin:  Negative for rash.  Neurological:  Negative for seizures, loss of consciousness and headaches.  Psychiatric/Behavioral:  Negative for suicidal ideas.   All other systems reviewed and are negative.    ____________________________________________   PHYSICAL EXAM:  VITAL SIGNS: ED Triage Vitals  Enc Vitals Group     BP 09/13/21 0301 (!) 138/97     Pulse Rate 09/13/21 0301 77     Resp 09/13/21 0301 20     Temp 09/13/21 0301 97.6 F (36.4 C)     Temp Source 09/13/21 0301 Oral     SpO2 09/13/21 0301 99 %     Weight 09/13/21 0301 134 lb (60.8 kg)     Height 09/13/21 0301 5\' 3"  (1.6 m)     Head Circumference --      Peak Flow --      Pain Score 09/13/21 0308 10     Pain Loc --      Pain Edu? --      Excl. in GC? --    Vitals:   09/13/21 0301 09/13/21 0751  BP: (!) 138/97 130/88  Pulse: 77 70  Resp: 20 18  Temp: 97.6 F (36.4 C)   SpO2: 99% 99%   Physical Exam Vitals and  nursing note reviewed.  Constitutional:      General: She is not in acute distress.    Appearance: She is well-developed.  HENT:     Head: Normocephalic and atraumatic.     Right Ear: External ear normal.     Left Ear: External ear normal.     Nose: Nose normal.  Eyes:     Conjunctiva/sclera: Conjunctivae normal.  Cardiovascular:     Rate and Rhythm: Normal rate and regular rhythm.     Heart sounds: No murmur heard. Pulmonary:     Effort: Pulmonary effort is  normal. No respiratory distress.     Breath sounds: Normal breath sounds.  Abdominal:     Palpations: Abdomen is soft.     Tenderness: There is no abdominal tenderness. There is right CVA tenderness and left CVA tenderness.  Musculoskeletal:        General: No swelling.     Cervical back: Neck supple.  Skin:    General: Skin is warm and dry.     Capillary Refill: Capillary refill takes less than 2 seconds.  Neurological:     Mental Status: She is alert.  Psychiatric:        Mood and Affect: Mood normal.     ____________________________________________   LABS (all labs ordered are listed, but only abnormal results are displayed)  Labs Reviewed  URINALYSIS, ROUTINE W REFLEX MICROSCOPIC - Abnormal; Notable for the following components:      Result Value   Color, Urine YELLOW (*)    APPearance HAZY (*)    Hgb urine dipstick LARGE (*)    Protein, ur 100 (*)    RBC / HPF >50 (*)    All other components within normal limits  BASIC METABOLIC PANEL - Abnormal; Notable for the following components:   Glucose, Bld 106 (*)    All other components within normal limits  CBC WITH DIFFERENTIAL/PLATELET - Abnormal; Notable for the following components:   WBC 14.4 (*)    Neutro Abs 12.2 (*)    All other components within normal limits  URINE CULTURE  POC URINE PREG, ED   ____________________________________________  EKG  ____________________________________________  RADIOLOGY  ED MD interpretation: Renal ultrasound  today shows new left hydronephrosis and unchanged right millimeter right renal stone.  No other acute process noted.  Official radiology report(s): US Renal  Result Date: 09/13/2021 CLINICAL DATA:  Left flank pain. EXAM: RENAL / URINARY TRACT ULTRASOUND COMPLETE COMPARISON:  CT abdomen pelvis and renal ultrasound dated September 11, 2021. FINDINGS: Right Kidney: Renal measurements: 10.0 x 4.1 x 5.7 cm = volume: 122 mL. Echogenicity within normal limits. Unchanged 9 mm calculus in the renal pelvis. No mass or hydronephrosis visualized. Left Kidney: Renal measurements: 10.1 x 5.9 x 5.9 cm = volume: 186 mL. Echogenicity within normal limits. New mild hydronephrosis. Bladder: Decompressed. Other: None. IMPRESSION: 1. New mild left hydronephrosis. 2. Unchanged 9 mm right renal pelvis calculus. Electronically Signed   By: Titus Dubin M.D.   On: 09/13/2021 09:22    ____________________________________________   PROCEDURES  Procedure(s) performed (including Critical Care):  Procedures   ____________________________________________   INITIAL IMPRESSION / ASSESSMENT AND PLAN / ED COURSE      Patient presents with above-stated history exam for assessment of some persistent right low back pain as well as development of some left lower back pain in the last 24 hours as well as burning with urination and.  No gross hematuria.  She was seen on 12/28 and diagnosed with right-sided kidney stone.  It seems she has not been able to fill her pain medicine.  I was able to review her CT performed on the 28th that showed dilation of the right renal pelvis with 8 mm stone in the right renal pelvis and a 3 mm density in the distal course of the left ureter close to the ureterovesicular junction.  There is also some enlarged mesenteric lymph nodes noted at that time.  On arrival today patient is afebrile and hemodynamically stable.  Abdomen is soft and she has some mild bilateral CVA tenderness.  Differential  considerations include ongoing symptomatic kidney stones, pyelonephritis, metabolic derangements, kidney injury with a lower suspicion for development of other interim acute abdominal process such as diverticulitis.  Patient is already status post appendectomy.  Low suspicion for torsion at this time.  Renal ultrasound today shows new left hydronephrosis and unchanged right millimeter right renal stone.  No other acute process noted.  Pregnancy test is negative.  BMP shows no evidence of a kidney injury or significant electrolyte or metabolic derangements.  BC shows new leukocytosis with WBC count of 14.4 compared to 10.12 days ago.  UA remarkable for large hemoglobin 100 protein and greater than 50 RBCs with 21-50 WBCs.  While there are no nitrites or leukocyte esterase or bacteria seen given rising white count and patient reporting burning with urination with bilateral CVA tenderness I think it is reasonable to cover with a course of antibiotics for possible early mild pyelonephritis.  We will send urine culture.  Advised patient importance of taking nausea and pain medicines previously prescribed and recommendation for close outpatient urology follow-up.  She is amenable this plan.  Do not believe she is septic and I have low suspicion  for other immediate life-threatening process.  Discharged in stable condition.      ____________________________________________   FINAL CLINICAL IMPRESSION(S) / ED DIAGNOSES  Final diagnoses:  Kidney stones  Pyelonephritis    Medications  oxyCODONE-acetaminophen (PERCOCET/ROXICET) 5-325 MG per tablet 1 tablet (has no administration in time range)  ibuprofen (ADVIL) tablet 400 mg (has no administration in time range)  ondansetron (ZOFRAN-ODT) disintegrating tablet 4 mg (4 mg Oral Given 09/13/21 0307)  oxyCODONE-acetaminophen (PERCOCET/ROXICET) 5-325 MG per tablet 1 tablet (1 tablet Oral Given 09/13/21 0307)     ED Discharge Orders          Ordered     sulfamethoxazole-trimethoprim (BACTRIM DS) 800-160 MG tablet  2 times daily        09/13/21 [id number]             Note:  This document was prepared using Dragon voice recognition software and may include unintentional dictation errors.    Lucrezia Starch, MD 09/13/21 [phone]

## 2021-09-14 LAB — URINE CULTURE

## 2021-09-18 ENCOUNTER — Other Ambulatory Visit: Payer: Self-pay

## 2021-09-18 ENCOUNTER — Telehealth: Payer: Self-pay

## 2021-09-18 ENCOUNTER — Other Ambulatory Visit: Payer: Self-pay | Admitting: Urology

## 2021-09-18 ENCOUNTER — Encounter: Payer: Self-pay | Admitting: Urology

## 2021-09-18 ENCOUNTER — Ambulatory Visit (INDEPENDENT_AMBULATORY_CARE_PROVIDER_SITE_OTHER): Payer: Medicaid Other | Admitting: Urology

## 2021-09-18 VITALS — BP 116/79 | HR 83 | Ht 63.0 in | Wt 134.0 lb

## 2021-09-18 DIAGNOSIS — N200 Calculus of kidney: Secondary | ICD-10-CM | POA: Diagnosis not present

## 2021-09-18 DIAGNOSIS — N201 Calculus of ureter: Secondary | ICD-10-CM

## 2021-09-18 MED ORDER — KETOROLAC TROMETHAMINE 10 MG PO TABS: 10.0000 mg | ORAL_TABLET | Freq: Four times a day (QID) | ORAL | 0 refills | Status: DC | PRN

## 2021-09-18 NOTE — Progress Notes (Signed)
[facility] Posting Form   Surgery Date/Time: Date: 09/20/2021  Surgeon: Dr. Legrand Rams, MD  Surgery Location: Day Surgery  Inpt ( No  )   Outpt (Yes)   Obs ( No  )   Diagnosis: N20.1 Right Ureteral Stone, N20.1 Left Ureteral Stone  -CPT: [phone]  Surgery: Bilateral Ureteroscopy with laser lithotripsy and stent placement   Stop Anticoagulations: No, may continue all  Cardiac/Medical/Pulmonary Clearance needed: no  *Orders entered into EPIC  Date: 09/18/21   *Case booked in EPIC  Date: 09/18/21  *Notified pt of Surgery: Date: 09/18/21  PRE-OP UA & CX: no  *Placed into Prior Authorization Work Que Date: 09/18/21   Assistant/laser/rep:No

## 2021-09-18 NOTE — Progress Notes (Signed)
09/18/21 9:44 AM   Kayla Weber [birth year] [id number]  CC: Left-sided flank pain, nephrolithiasis  HPI: 21 year old female with anxiety/depression/ADHD and history of recurrent stone disease who presents with left-sided flank pain.  She is here today with her grandmother.  She has multiple ER visits over the last week with primarily left-sided flank pain, but is also had some dull right-sided flank pain.  She has some urgency, frequency, and feeling of incomplete emptying, but no significant dysuria.  Work-up in the ER showed left-sided hydronephrosis secondary to a 3 mm left distal stone, as well as an 8 mm right renal pelvis stone with likely intermittent ball valving obstruction.  Renal function normal, WBC mildly elevated at 14.4, urinalysis with greater than 50 RBCs, 20-50 WBCs, no bacteria, no squamous cells, no leukocytes, nitrite negative, culture showed only mixed species.  She was started on Bactrim and discharged with urology follow-up.  She reportedly has a distant history of seizures, but reportedly was told she does not have epilepsy and her seizures were pain related.  She denies any fevers or chills.  She is currently alternating ibuprofen and oxycodone for pain.  She has a history of ureteroscopy in 2018 at [facility], and tolerated her stent well.   PMH: Past Medical History:  Diagnosis Date   ADHD    Anemia    Anxiety    Appendicitis, acute 10/11/15   Depression    Inflammatory bowel disease    Kidney stones    Mononucleosis 06/12/2017   Ovarian cyst 06/01/2017   Seizures ([place])    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-PTS GRANDMOTHER STATES PT ALMOST HAD ONE PRIOR TO LAST KIDNEY STONE SURGERY IN APRIL 2018   Urinary tract infection 05/2017    Surgical History: Past Surgical History:  Procedure Laterality Date   APPENDECTOMY     GANGLION CYST EXCISION Right 08/05/2017   Procedure: REMOVAL GANGLION OF WRIST;  Surgeon: Earnestine Leys, MD;  Location: [facility] ORS;  Service:  Orthopedics;  Laterality: Right;   KIDNEY STONE SURGERY  01/08/2017   LAPAROSCOPIC APPENDECTOMY N/A 10/11/2015   Procedure: APPENDECTOMY LAPAROSCOPIC;  Surgeon: Florene Glen, MD;  Location: [facility] ORS;  Service: General;  Laterality: N/A;   OTHER SURGICAL HISTORY  2014   Stitches under her chin    TONSILLECTOMY  2013   [facility]      Family History: Family History  Problem Relation Age of Onset   Seizures Mother        As an adolescent and currently takes medication for seizures   Seizures Maternal Uncle        At the age of 3 but did not have many afterwards and never had to be placed on medication as a child or adult.    Social History:  reports that she has never smoked. She has never used smokeless tobacco. She reports that she does not drink alcohol and does not use drugs.  Physical Exam: BP 116/79    Pulse 83    Ht 5\' 3"  (1.6 m)    Wt 134 lb (60.8 kg)    LMP  (LMP Unknown)    BMI 23.74 kg/m    Constitutional:  Alert and oriented, No acute distress. Cardiovascular: Regular rate and rhythm Respiratory: Clear to auscultation bilaterally GI: Abdomen is soft, nontender, nondistended, no abdominal masses  Laboratory Data: Reviewed, see HPI Calcium normal at 9.4  Pertinent Imaging: I have personally viewed and interpreted the renal ultrasound and CT showing a 3 mm left distal ureteral  stone with hydronephrosis, as well as an 8 mm right renal stone  Assessment & Plan:   21 year old female with recurrent stone disease, currently with left-sided flank pain secondary to a 3 mm left distal ureteral stone, in addition to occasional right-sided flank pain from an 8 mm right renal pelvis stone likely causing intermittent ball valving obstruction.  We discussed various treatment options for urolithiasis including observation with or without medical expulsive therapy, shockwave lithotripsy (SWL), ureteroscopy and laser lithotripsy with stent placement, and percutaneous  nephrolithotomy.  We discussed that management is based on stone size, location, density, patient co-morbidities, and patient preference.   Stones <65mm in size have a >80% spontaneous passage rate. Data surrounding the use of tamsulosin for medical expulsive therapy is controversial, but meta analyses suggests it is most efficacious for distal stones between 5-101mm in size. Possible side effects include dizziness/lightheadedness, and retrograde ejaculation.  SWL has a lower stone free rate in a single procedure, but also a lower complication rate compared to ureteroscopy and avoids a stent and associated stent related symptoms. Possible complications include renal hematoma, steinstrasse, and need for additional treatment.  Ureteroscopy with laser lithotripsy and stent placement has a higher stone free rate than SWL in a single procedure, however increased complication rate including possible infection, ureteral injury, bleeding, and stent related morbidity. Common stent related symptoms include dysuria, urgency/frequency, and flank pain.  With her multiple ER visits and left-sided ureteral stone, with likely intermittent right-sided flank pain from right renal obstruction from her larger 8 mm right renal stone, I recommended cystoscopy, bilateral ureteroscopy, laser lithotripsy, and stent placement this week.  She reportedly has had a 24-hour urine in the past, and is not interested in collecting this again, but we will re-discuss again after stone removal, and try to send stone for analysis   Nickolas Madrid, MD 09/18/2021  [facility] [address], [place], [place] [address] [phone]

## 2021-09-18 NOTE — H&P (View-Only) (Signed)
09/18/21 9:44 AM   Kayla Weber 12/18/2000 [id number]  CC: Left-sided flank pain, nephrolithiasis  HPI: 21 year old female with anxiety/depression/ADHD and history of recurrent stone disease who presents with left-sided flank pain.  She is here today with her grandmother.  She has multiple ER visits over the last week with primarily left-sided flank pain, but is also had some dull right-sided flank pain.  She has some urgency, frequency, and feeling of incomplete emptying, but no significant dysuria.  Work-up in the ER showed left-sided hydronephrosis secondary to a 3 mm left distal stone, as well as an 8 mm right renal pelvis stone with likely intermittent ball valving obstruction.  Renal function normal, WBC mildly elevated at 14.4, urinalysis with greater than 50 RBCs, 20-50 WBCs, no bacteria, no squamous cells, no leukocytes, nitrite negative, culture showed only mixed species.  She was started on Bactrim and discharged with urology follow-up.  She reportedly has a distant history of seizures, but reportedly was told she does not have epilepsy and her seizures were pain related.  She denies any fevers or chills.  She is currently alternating ibuprofen and oxycodone for pain.  She has a history of ureteroscopy in 2018 at [facility], and tolerated her stent well.   PMH: Past Medical History:  Diagnosis Date   ADHD    Anemia    Anxiety    Appendicitis, acute 10/11/15   Depression    Inflammatory bowel disease    Kidney stones    Mononucleosis 06/12/2017   Ovarian cyst 06/01/2017   Seizures ([place])    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-PTS GRANDMOTHER STATES PT ALMOST HAD ONE PRIOR TO LAST KIDNEY STONE SURGERY IN APRIL 2018   Urinary tract infection 05/2017    Surgical History: Past Surgical History:  Procedure Laterality Date   APPENDECTOMY     GANGLION CYST EXCISION Right 08/05/2017   Procedure: REMOVAL GANGLION OF WRIST;  Surgeon: Earnestine Leys, MD;  Location: [facility] ORS;  Service:  Orthopedics;  Laterality: Right;   KIDNEY STONE SURGERY  01/08/2017   LAPAROSCOPIC APPENDECTOMY N/A 10/11/2015   Procedure: APPENDECTOMY LAPAROSCOPIC;  Surgeon: Florene Glen, MD;  Location: [facility] ORS;  Service: General;  Laterality: N/A;   OTHER SURGICAL HISTORY  2014   Stitches under her chin    TONSILLECTOMY  2013   [facility]      Family History: Family History  Problem Relation Age of Onset   Seizures Mother        As an adolescent and currently takes medication for seizures   Seizures Maternal Uncle        At the age of 3 but did not have many afterwards and never had to be placed on medication as a child or adult.    Social History:  reports that she has never smoked. She has never used smokeless tobacco. She reports that she does not drink alcohol and does not use drugs.  Physical Exam: BP 116/79    Pulse 83    Ht 5\' 3"  (1.6 m)    Wt 134 lb (60.8 kg)    LMP  (LMP Unknown)    BMI 23.74 kg/m    Constitutional:  Alert and oriented, No acute distress. Cardiovascular: Regular rate and rhythm Respiratory: Clear to auscultation bilaterally GI: Abdomen is soft, nontender, nondistended, no abdominal masses  Laboratory Data: Reviewed, see HPI Calcium normal at 9.4  Pertinent Imaging: I have personally viewed and interpreted the renal ultrasound and CT showing a 3 mm left distal ureteral  stone with hydronephrosis, as well as an 8 mm right renal stone  Assessment & Plan:   21 year old female with recurrent stone disease, currently with left-sided flank pain secondary to a 3 mm left distal ureteral stone, in addition to occasional right-sided flank pain from an 8 mm right renal pelvis stone likely causing intermittent ball valving obstruction.  We discussed various treatment options for urolithiasis including observation with or without medical expulsive therapy, shockwave lithotripsy (SWL), ureteroscopy and laser lithotripsy with stent placement, and percutaneous  nephrolithotomy.  We discussed that management is based on stone size, location, density, patient co-morbidities, and patient preference.   Stones <14mm in size have a >80% spontaneous passage rate. Data surrounding the use of tamsulosin for medical expulsive therapy is controversial, but meta analyses suggests it is most efficacious for distal stones between 5-27mm in size. Possible side effects include dizziness/lightheadedness, and retrograde ejaculation.  SWL has a lower stone free rate in a single procedure, but also a lower complication rate compared to ureteroscopy and avoids a stent and associated stent related symptoms. Possible complications include renal hematoma, steinstrasse, and need for additional treatment.  Ureteroscopy with laser lithotripsy and stent placement has a higher stone free rate than SWL in a single procedure, however increased complication rate including possible infection, ureteral injury, bleeding, and stent related morbidity. Common stent related symptoms include dysuria, urgency/frequency, and flank pain.  With her multiple ER visits and left-sided ureteral stone, with likely intermittent right-sided flank pain from right renal obstruction from her larger 8 mm right renal stone, I recommended cystoscopy, bilateral ureteroscopy, laser lithotripsy, and stent placement this week.  She reportedly has had a 24-hour urine in the past, and is not interested in collecting this again, but we will re-discuss again after stone removal, and try to send stone for analysis   Nickolas Madrid, MD 09/18/2021  [facility] [address] [place], [place] [address] [phone]

## 2021-09-18 NOTE — Progress Notes (Signed)
Surgical Physician Order [facility] [place]  * Scheduling expectation :  Friday, 09/20/2021  *Length of Case: 1 hour  *Clearance needed: no  *Anticoagulation Instructions: May continue all anticoagulants  *Aspirin Instructions: Ok to continue all  *Post-op visit Date/Instructions: TBD  *Diagnosis: Left ureteral stone, right ureteral stone  *Procedure: bilateral Ureteroscopy w/laser lithotripsy & stent placement (09735)   Additional orders: N/A  -Admit type: OUTpatient  -Anesthesia: General  -VTE Prophylaxis Standing Order SCDs       Other:   -Standing Lab Orders Per Anesthesia    Lab other: None  -Standing Test orders EKG/Chest x-ray per Anesthesia       Test other:   - Medications:  Cipro 400mg  IV  -Other orders:  N/A

## 2021-09-18 NOTE — Patient Instructions (Signed)
Laser Therapy for Kidney Stones °Laser therapy for kidney stones is a procedure to break up small, hard mineral deposits that form in the kidney (kidney stones). The procedure is done using a device that produces a focused beam of light (laser). The laser breaks up kidney stones into pieces that are small enough to be passed out of the body through urination or removed from the body during the procedure. You may need laser therapy if you have kidney stones that are painful or block your urinary tract. °This procedure is done by inserting a tube (ureteroscope) into your kidney through the urethral opening. The urethra is the part of the body that drains urine from the bladder. In women, the urethra opens above the vaginal opening. In men, the urethra opens at the tip of the penis. The ureteroscope is inserted through the urethra, and surgical instruments are moved through the bladder and the muscular tube that connects the kidney to the bladder (ureter) until they reach the kidney. °Tell a health care provider about: °Any allergies you have. °All medicines you are taking, including vitamins, herbs, eye drops, creams, and over-the-counter medicines. °Any problems you or family members have had with anesthetic medicines. °Any blood disorders you have. °Any surgeries you have had. °Any medical conditions you have. °Whether you are pregnant or may be pregnant. °What are the risks? °Generally, this is a safe procedure. However, problems may occur, including: °Infection. °Bleeding. °Allergic reactions to medicines. °Damage to the urethra, bladder, or ureter. °Urinary tract infection (UTI). °Narrowing of the urethra (urethral stricture). °Difficulty passing urine. °Blockage of the kidney caused by a fragment of kidney stone. °What happens before the procedure? °Medicines °Ask your health care provider about: °Changing or stopping your regular medicines. This is especially important if you are taking diabetes medicines or  blood thinners. °Taking medicines such as aspirin and ibuprofen. These medicines can thin your blood. Do not take these medicines unless your health care provider tells you to take them. °Taking over-the-counter medicines, vitamins, herbs, and supplements. °Eating and drinking °Follow instructions from your health care provider about eating and drinking, which may include: °8 hours before the procedure - stop eating heavy meals or foods, such as meat, fried foods, or fatty foods. °6 hours before the procedure - stop eating light meals or foods, such as toast or cereal. °6 hours before the procedure - stop drinking milk or drinks that contain milk. °2 hours before the procedure - stop drinking clear liquids. °Staying hydrated °Follow instructions from your health care provider about hydration, which may include: °Up to 2 hours before the procedure - you may continue to drink clear liquids, such as water, clear fruit juice, black coffee, and plain tea. ° °General instructions °You may have a physical exam before the procedure. You may also have tests, such as imaging tests and blood or urine tests. °If your ureter is too narrow, your health care provider may place a soft, flexible tube (stent) inside of it. The stent may be placed days or weeks before your laser therapy procedure. °Plan to have someone take you home from the hospital or clinic. °If you will be going home right after the procedure, plan to have someone stay with you for 24 hours. °Do not use any products that contain nicotine or tobacco for at least 4 weeks before the procedure. These products include cigarettes, e-cigarettes, and chewing tobacco. If you need help quitting, ask your health care provider. °Ask your health care provider: °How your   surgical site will be marked or identified. °What steps will be taken to help prevent infection. These may include: °Removing hair at the surgery site. °Washing skin with a germ-killing soap. °Taking antibiotic  medicine. °What happens during the procedure? ° °An IV will be inserted into one of your veins. °You will be given one or more of the following: °A medicine to help you relax (sedative). °A medicine to numb the area (local anesthetic). °A medicine to make you fall asleep (general anesthetic). °A ureteroscope will be inserted into your urethra. The ureteroscope will send images to a video screen in the operating room to guide your surgeon to the area of your kidney that will be treated. °A small, flexible tube will be threaded through the ureteroscope and into your bladder and ureter, up to your kidney. °The laser device will be inserted into your kidney through the tube. Your surgeon will pulse the laser on and off to break up kidney stones. °A surgical instrument that has a tiny wire basket may be inserted through the tube into your kidney to remove the pieces of broken kidney stone. °The procedure may vary among health care providers and hospitals. °What happens after the procedure? °Your blood pressure, heart rate, breathing rate, and blood oxygen level will be monitored until you leave the hospital or clinic. °You will be given pain medicine as needed. °You may continue to receive antibiotics. °You may have a stent temporarily placed in your ureter. °Do not drive for 24 hours if you were given a sedative during your procedure. °You may be given a strainer to collect any stone fragments that you pass in your urine. Your health care provider may have these tested. °Summary °Laser therapy for kidney stones is a procedure to break up kidney stones into pieces that are small enough to be passed out of the body through urination or removed during the procedure. °Follow instructions from your health care provider about eating and drinking before the procedure. °During the procedure, the ureteroscope will send images to a video screen to guide your surgeon to the area of your kidney that will be treated. °Do not drive  for 24 hours if you were given a sedative during your procedure. °This information is not intended to replace advice given to you by your health care provider. Make sure you discuss any questions you have with your health care provider. °Document Revised: 05/06/2021 Document Reviewed: 05/06/2021 °Elsevier Patient Education © 2022 [employer] ° °Ureteral Stent Implantation °Ureteral stent implantation is a procedure to insert (implant) a flexible, soft, plastic tube (stent) into a ureter. Ureters are the tube-like parts of the body that drain urine from the kidneys. The stent supports the ureter while it heals and helps to drain urine. You may have a ureteral stent implanted after having a procedure to remove a blockage from the ureter (ureterolysis or pyeloplasty). You may also have a stent implanted to open the flow of urine when you have a blockage caused by a kidney stone, tumor, blood clot, or infection. °You have two ureters, one on each side of the body. The ureters connect the kidneys to the organ that holds urine until it passes out of the body (bladder). The stent is placed so that one end is in the kidney, and one end is in the bladder. The stent is usually taken out after your ureter has healed. Depending on your condition, you may have a stent for just a few weeks, or you may have   a long-term stent that will need to be replaced every few months. °Tell a health care provider about: °Any allergies you have. °All medicines you are taking, including vitamins, herbs, eye drops, creams, and over-the-counter medicines. °Any problems you or family members have had with anesthetic medicines. °Any blood disorders you have. °Any surgeries you have had. °Any medical conditions you have. °Whether you are pregnant or may be pregnant. °What are the risks? °Generally, this is a safe procedure. However, problems may occur, including: °Infection. °Bleeding. °Allergic reactions to medicines. °Damage to other structures  or organs. Tearing (perforation) of the ureter is possible. °Movement of the stent away from where it is placed during surgery (migration). °What happens before the procedure? °Medicines °Ask your health care provider about: °Changing or stopping your regular medicines. This is especially important if you are taking diabetes medicines or blood thinners. °Taking medicines such as aspirin and ibuprofen. These medicines can thin your blood. Do not take these medicines unless your health care provider tells you to take them. °Taking over-the-counter medicines, vitamins, herbs, and supplements. °Eating and drinking °Follow instructions from your health care provider about eating and drinking, which may include: °8 hours before the procedure - stop eating heavy meals or foods, such as meat, fried foods, or fatty foods. °6 hours before the procedure - stop eating light meals or foods, such as toast or cereal. °6 hours before the procedure - stop drinking milk or drinks that contain milk. °2 hours before the procedure - stop drinking clear liquids. °Staying hydrated °Follow instructions from your health care provider about hydration, which may include: °Up to 2 hours before the procedure - you may continue to drink clear liquids, such as water, clear fruit juice, black coffee, and plain tea. °General instructions °Do not drink alcohol. °Do not use any products that contain nicotine or tobacco for at least 4 weeks before the procedure. These products include cigarettes, e-cigarettes, and chewing tobacco. If you need help quitting, ask your health care provider. °You may have an exam or testing, such as imaging or blood tests. °Ask your health care provider what steps will be taken to help prevent infection. These may include: °Removing hair at the surgery site. °Washing skin with a germ-killing soap. °Taking antibiotic medicine. °Plan to have someone take you home from the hospital or clinic. °If you will be going home right  after the procedure, plan to have someone with you for 24 hours. °What happens during the procedure? °An IV will be inserted into one of your veins. °You may be given a medicine to help you relax (sedative). °You may be given a medicine to make you fall asleep (general anesthetic). °A thin, tube-shaped instrument with a light and tiny camera at the end (cystoscope) will be inserted into your urethra. The urethra is the tube that drains urine from the bladder out of the body. In men, the urethra opens at the end of the penis. In women, the urethra opens in front of the vaginal opening. °The cystoscope will be passed into your bladder. °A thin wire (guide wire) will be passed through your bladder and into your ureter. This is used to guide the stent into your ureter. °The stent will be inserted into your ureter. °The guide wire and the cystoscope will be removed. °A flexible tube (catheter) may be inserted through your urethra so that one end is in your bladder. This helps to drain urine from your bladder. °The procedure may vary among hospitals and   health care providers. °What happens after the procedure? °Your blood pressure, heart rate, breathing rate, and blood oxygen level will be monitored until you leave the hospital or clinic. °You may continue to receive medicine and fluids through an IV. °You may have some soreness or pain in your abdomen and urethra. Medicines will be available to help you. °You will be encouraged to get up and walk around as soon as you can. °You may have a catheter draining your urine. °You will have some blood in your urine. °Do not drive for 24 hours if you were given a sedative during your procedure. °Summary °Ureteral stent implantation is a procedure to insert a flexible, soft, plastic tube (stent) into a ureter. °You may have a stent implanted to support the ureter while it heals after a procedure or to open the flow of urine if there is a blockage. °Follow instructions from your  health care provider about taking medicines and about eating and drinking before the procedure. °Depending on your condition, you may have a stent for just a few weeks, or you may have a long-term stent that will need to be replaced every few months. °This information is not intended to replace advice given to you by your health care provider. Make sure you discuss any questions you have with your health care provider. °Document Revised: 06/08/2018 Document Reviewed: 06/09/2018 °Elsevier Patient Education © 2022 [employer] Inc. ° °

## 2021-09-18 NOTE — Telephone Encounter (Signed)
Met with patient in office and discussed possible surgery dates. Patient agreed upon Friday January 6th with Dr. Nickolas Madrid, MD. Advised that I would be in touch with her post-op to set up her follow up appointments. All patients  questions were addressed and answered during face to face visit. Patient advised to call our office with any questions or problems that may arise. Patient verbalized understanding.

## 2021-09-19 ENCOUNTER — Other Ambulatory Visit
Admission: RE | Admit: 2021-09-19 | Discharge: 2021-09-19 | Disposition: A | Discharge status: Home / Self Care | Payer: Medicaid Other | Source: Ambulatory Visit | Attending: Urology | Admitting: Urology

## 2021-09-19 NOTE — Patient Instructions (Signed)
Your procedure is scheduled on: Friday September 20, 2021. Report to Day [facility] inside [facility] 2nd floor stop by admissions desk before getting on elevator. To find out your arrival time please call [phone] between 1PM - 3PM on Thursday September 19, 2021.  Remember: Instructions that are not followed completely may result in serious medical risk,  up to and including death, or upon the discretion of your surgeon and anesthesiologist your  surgery may need to be rescheduled.     _X__ 1. Do not eat food or drink fluids after midnight the night before your procedure.                 No chewing gum or hard candies.   __X__2.  On the morning of surgery brush your teeth with toothpaste and water, you                may rinse your mouth with mouthwash if you wish.  Do not swallow any toothpaste or mouthwash.     _X__ 3.  No Alcohol for 24 hours before or after surgery.   _X__ 4.  Do Not Smoke or use e-cigarettes For 24 Hours Prior to Your Surgery.                 Do not use any chewable tobacco products for at least 6 hours prior to                 Surgery.  _X__  5.  Do not use any recreational drugs (marijuana, cocaine, heroin, ecstasy, MDMA or other)                For at least one week prior to your surgery.  Combination of these drugs with anesthesia                May have life threatening results.   __X__6.  Notify your doctor if there is any change in your medical condition      (cold, fever, infections).     Do not wear jewelry, make-up, hairpins, clips or nail polish. Do not wear lotions, powders, or perfumes. You may wear deodorant. Do not shave 48 hours prior to surgery.  Do not bring valuables to the hospital.    [facility] is not responsible for any belongings or valuables.  Contacts, dentures or bridgework may not be worn into surgery. Leave your suitcase in the car. After surgery it may be brought to your room. For patients admitted to the  hospital, discharge time is determined by your treatment team.   Patients discharged the day of surgery will not be allowed to drive home.   Make arrangements for someone to be with you for the first 24 hours of your Same Day Discharge.  __X__ Take these medicines the morning of surgery with A SIP OF WATER:    1. sulfamethoxazole-trimethoprim (BACTRIM DS) 800-160 MG  2. norethindrone-ethinyl estradiol 1/35 (ORTHO-NOVUM)  3.   4.  5.  6.  ____ Fleet Enema (as directed)   ____ Use CHG Soap (or wipes) as directed  ____ Use Benzoyl Peroxide Gel as instructed  ____ Use inhalers on the day of surgery  ____ Stop metformin 2 days prior to surgery    ____ Take 1/2 of usual insulin dose the night before surgery. No insulin the morning          of surgery.   ____ Call your PCP, cardiologist, or Pulmonologist if taking Coumadin/Plavix/aspirin and ask  when to stop before your surgery.   __X__ One Week prior to surgery- Stop Anti-inflammatories such as Ibuprofen, Aleve, Advil, Motrin, meloxicam (MOBIC), diclofenac, etodolac, ketorolac, Toradol, Daypro, piroxicam, Goody's or BC powders. OK TO USE TYLENOL IF NEEDED   __X__ Stop supplements until after surgery.    ____ Bring C-Pap to the hospital.    If you have any questions regarding your pre-procedure instructions,  Please call Pre-admit Testing at [phone]

## 2021-09-20 ENCOUNTER — Other Ambulatory Visit: Payer: Self-pay

## 2021-09-20 ENCOUNTER — Ambulatory Visit: Payer: Medicaid Other | Admitting: Anesthesiology

## 2021-09-20 ENCOUNTER — Encounter
Admission: RE | Disposition: A | Discharge status: Home / Self Care | Payer: Self-pay | Source: Home / Self Care | Attending: Urology

## 2021-09-20 ENCOUNTER — Ambulatory Visit
Admission: RE | Admit: 2021-09-20 | Discharge: 2021-09-20 | Disposition: A | Discharge status: Home / Self Care | Payer: Medicaid Other | Attending: Urology | Admitting: Urology

## 2021-09-20 ENCOUNTER — Encounter: Payer: Self-pay | Admitting: Urology

## 2021-09-20 ENCOUNTER — Ambulatory Visit: Payer: Medicaid Other

## 2021-09-20 DIAGNOSIS — N201 Calculus of ureter: Secondary | ICD-10-CM | POA: Diagnosis present

## 2021-09-20 DIAGNOSIS — N202 Calculus of kidney with calculus of ureter: Secondary | ICD-10-CM

## 2021-09-20 DIAGNOSIS — Z87440 Personal history of urinary (tract) infections: Secondary | ICD-10-CM | POA: Diagnosis not present

## 2021-09-20 DIAGNOSIS — N200 Calculus of kidney: Secondary | ICD-10-CM | POA: Diagnosis not present

## 2021-09-20 HISTORY — PX: CYSTOSCOPY/URETEROSCOPY/HOLMIUM LASER/STENT PLACEMENT: SHX6546

## 2021-09-20 SURGERY — CYSTOSCOPY/URETEROSCOPY/HOLMIUM LASER/STENT PLACEMENT
Anesthesia: General | Laterality: Bilateral

## 2021-09-20 MED ORDER — CEFAZOLIN SODIUM-DEXTROSE 1-4 GM/50ML-% IV SOLN
INTRAVENOUS | Status: AC
  Filled 2021-09-20: qty 50

## 2021-09-20 MED ORDER — ONDANSETRON HCL 4 MG/2ML IJ SOLN
INTRAMUSCULAR | Status: DC | PRN
  Administered 2021-09-20: 4 mg via INTRAVENOUS

## 2021-09-20 MED ORDER — FENTANYL CITRATE (PF) 100 MCG/2ML IJ SOLN
INTRAMUSCULAR | Status: DC | PRN
  Administered 2021-09-20 (×2): 50 ug via INTRAVENOUS

## 2021-09-20 MED ORDER — OXYCODONE HCL 5 MG PO TABS
ORAL_TABLET | ORAL | Status: AC
  Filled 2021-09-20: qty 1

## 2021-09-20 MED ORDER — DEXAMETHASONE SODIUM PHOSPHATE 10 MG/ML IJ SOLN
INTRAMUSCULAR | Status: AC
  Filled 2021-09-20: qty 1

## 2021-09-20 MED ORDER — TAMSULOSIN HCL 0.4 MG PO CAPS: 0.4000 mg | ORAL_CAPSULE | Freq: Every day | ORAL | 0 refills | Status: DC

## 2021-09-20 MED ORDER — MIDAZOLAM HCL 2 MG/2ML IJ SOLN
INTRAMUSCULAR | Status: DC | PRN
  Administered 2021-09-20: 2 mg via INTRAVENOUS

## 2021-09-20 MED ORDER — LACTATED RINGERS IV SOLN: INTRAVENOUS | Status: DC

## 2021-09-20 MED ORDER — FAMOTIDINE 20 MG PO TABS: 20.0000 mg | ORAL_TABLET | Freq: Once | ORAL | Status: AC

## 2021-09-20 MED ORDER — ONDANSETRON HCL 4 MG/2ML IJ SOLN
INTRAMUSCULAR | Status: AC
  Filled 2021-09-20: qty 2

## 2021-09-20 MED ORDER — ORAL CARE MOUTH RINSE: 15.0000 mL | Freq: Once | OROMUCOSAL | Status: AC

## 2021-09-20 MED ORDER — SUCCINYLCHOLINE CHLORIDE 200 MG/10ML IV SOSY
PREFILLED_SYRINGE | INTRAVENOUS | Status: DC | PRN
  Administered 2021-09-20: 80 mg via INTRAVENOUS

## 2021-09-20 MED ORDER — OXYBUTYNIN CHLORIDE ER 10 MG PO TB24: 10.0000 mg | ORAL_TABLET | Freq: Every day | ORAL | 0 refills | Status: AC | PRN

## 2021-09-20 MED ORDER — PENTAFLUOROPROP-TETRAFLUOROETH EX AERO
INHALATION_SPRAY | CUTANEOUS | Status: AC
  Filled 2021-09-20: qty 30

## 2021-09-20 MED ORDER — KETOROLAC TROMETHAMINE 30 MG/ML IJ SOLN
INTRAMUSCULAR | Status: AC
  Filled 2021-09-20: qty 1

## 2021-09-20 MED ORDER — SODIUM CHLORIDE 0.9 % IR SOLN
Status: DC | PRN
  Administered 2021-09-20: 3000 mL via INTRAVESICAL

## 2021-09-20 MED ORDER — DIPHENHYDRAMINE HCL 50 MG/ML IJ SOLN
INTRAMUSCULAR | Status: DC | PRN
  Administered 2021-09-20: 25 mg via INTRAVENOUS

## 2021-09-20 MED ORDER — FENTANYL CITRATE (PF) 100 MCG/2ML IJ SOLN
INTRAMUSCULAR | Status: AC
  Filled 2021-09-20: qty 2

## 2021-09-20 MED ORDER — OXYCODONE HCL 5 MG PO TABS: 5.0000 mg | ORAL_TABLET | Freq: Four times a day (QID) | ORAL | 0 refills | Status: AC | PRN

## 2021-09-20 MED ORDER — CEFAZOLIN SODIUM-DEXTROSE 1-4 GM/50ML-% IV SOLN
1.0000 g | INTRAVENOUS | Status: AC
Start: 2021-09-20 — End: 2021-09-20
  Administered 2021-09-20: 1 g via INTRAVENOUS

## 2021-09-20 MED ORDER — SUGAMMADEX SODIUM 200 MG/2ML IV SOLN
INTRAVENOUS | Status: DC | PRN
  Administered 2021-09-20: 121.6 mg via INTRAVENOUS

## 2021-09-20 MED ORDER — DIPHENHYDRAMINE HCL 50 MG/ML IJ SOLN
INTRAMUSCULAR | Status: AC
  Filled 2021-09-20: qty 1

## 2021-09-20 MED ORDER — FAMOTIDINE 20 MG PO TABS
ORAL_TABLET | ORAL | Status: AC
  Administered 2021-09-20: 20 mg via ORAL
  Filled 2021-09-20: qty 1

## 2021-09-20 MED ORDER — LIDOCAINE HCL URETHRAL/MUCOSAL 2 % EX GEL
CUTANEOUS | Status: AC
  Filled 2021-09-20: qty 10

## 2021-09-20 MED ORDER — MIDAZOLAM HCL 2 MG/2ML IJ SOLN
INTRAMUSCULAR | Status: AC
  Filled 2021-09-20: qty 2

## 2021-09-20 MED ORDER — LIDOCAINE HCL (CARDIAC) PF 100 MG/5ML IV SOSY
PREFILLED_SYRINGE | INTRAVENOUS | Status: DC | PRN
  Administered 2021-09-20: 80 mg via INTRAVENOUS

## 2021-09-20 MED ORDER — MEPERIDINE HCL 25 MG/ML IJ SOLN: 12.5000 mg | INTRAMUSCULAR | Status: DC | PRN

## 2021-09-20 MED ORDER — CIPROFLOXACIN IN D5W 400 MG/200ML IV SOLN
INTRAVENOUS | Status: AC
  Filled 2021-09-20: qty 200

## 2021-09-20 MED ORDER — DEXMEDETOMIDINE (PRECEDEX) IN NS 20 MCG/5ML (4 MCG/ML) IV SYRINGE
PREFILLED_SYRINGE | INTRAVENOUS | Status: DC | PRN
  Administered 2021-09-20 (×3): 8 ug via INTRAVENOUS

## 2021-09-20 MED ORDER — DEXAMETHASONE SODIUM PHOSPHATE 10 MG/ML IJ SOLN
INTRAMUSCULAR | Status: DC | PRN
  Administered 2021-09-20: 10 mg via INTRAVENOUS

## 2021-09-20 MED ORDER — OXYCODONE HCL 5 MG PO TABS
5.0000 mg | ORAL_TABLET | Freq: Once | ORAL | Status: AC
  Administered 2021-09-20: 5 mg via ORAL

## 2021-09-20 MED ORDER — BELLADONNA ALKALOIDS-OPIUM 16.2-60 MG RE SUPP
RECTAL | Status: DC | PRN
  Administered 2021-09-20: 1 via RECTAL

## 2021-09-20 MED ORDER — ONDANSETRON HCL 4 MG/2ML IJ SOLN: 4.0000 mg | Freq: Once | INTRAMUSCULAR | Status: DC | PRN

## 2021-09-20 MED ORDER — MEPERIDINE HCL 25 MG/ML IJ SOLN
INTRAMUSCULAR | Status: AC
  Administered 2021-09-20: 12.5 mg via INTRAVENOUS
  Filled 2021-09-20: qty 1

## 2021-09-20 MED ORDER — FENTANYL CITRATE (PF) 100 MCG/2ML IJ SOLN: 25.0000 ug | INTRAMUSCULAR | Status: DC | PRN

## 2021-09-20 MED ORDER — LIDOCAINE HCL (PF) 2 % IJ SOLN
INTRAMUSCULAR | Status: AC
  Filled 2021-09-20: qty 5

## 2021-09-20 MED ORDER — CHLORHEXIDINE GLUCONATE 0.12 % MT SOLN
OROMUCOSAL | Status: AC
  Administered 2021-09-20: 15 mL via OROMUCOSAL
  Filled 2021-09-20: qty 15

## 2021-09-20 MED ORDER — LIDOCAINE HCL URETHRAL/MUCOSAL 2 % EX GEL
CUTANEOUS | Status: DC | PRN
  Administered 2021-09-20: 1 via URETHRAL

## 2021-09-20 MED ORDER — BELLADONNA ALKALOIDS-OPIUM 16.2-60 MG RE SUPP
RECTAL | Status: AC
  Filled 2021-09-20: qty 1

## 2021-09-20 MED ORDER — ROCURONIUM BROMIDE 100 MG/10ML IV SOLN
INTRAVENOUS | Status: DC | PRN
  Administered 2021-09-20: 35 mg via INTRAVENOUS
  Administered 2021-09-20: 5 mg via INTRAVENOUS

## 2021-09-20 MED ORDER — PROPOFOL 10 MG/ML IV BOLUS
INTRAVENOUS | Status: AC
  Filled 2021-09-20: qty 40

## 2021-09-20 MED ORDER — CHLORHEXIDINE GLUCONATE 0.12 % MT SOLN: 15.0000 mL | Freq: Once | OROMUCOSAL | Status: AC

## 2021-09-20 MED ORDER — IOHEXOL 180 MG/ML  SOLN
INTRAMUSCULAR | Status: DC | PRN
  Administered 2021-09-20: 10 mL

## 2021-09-20 MED ORDER — CIPROFLOXACIN IN D5W 400 MG/200ML IV SOLN: 400.0000 mg | INTRAVENOUS | Status: DC

## 2021-09-20 MED ORDER — ROCURONIUM BROMIDE 10 MG/ML (PF) SYRINGE
PREFILLED_SYRINGE | INTRAVENOUS | Status: AC
  Filled 2021-09-20: qty 10

## 2021-09-20 MED ORDER — KETOROLAC TROMETHAMINE 30 MG/ML IJ SOLN
INTRAMUSCULAR | Status: DC | PRN
  Administered 2021-09-20: 30 mg via INTRAVENOUS

## 2021-09-20 SURGICAL SUPPLY — 35 items
ADH LQ OCL WTPRF AMP STRL LF (MISCELLANEOUS) ×1
ADHESIVE MASTISOL STRL (MISCELLANEOUS) ×1 IMPLANT
BAG DRAIN CYSTO-URO LG1000N (MISCELLANEOUS) ×2 IMPLANT
BRUSH SCRUB EZ 1% IODOPHOR (MISCELLANEOUS) ×2 IMPLANT
CATH URET FLEX-TIP 2 LUMEN 10F (CATHETERS) IMPLANT
CATH URETL OPEN 5X70 (CATHETERS) IMPLANT
CNTNR SPEC 2.5X3XGRAD LEK (MISCELLANEOUS) ×1
CONT SPEC 4OZ STER OR WHT (MISCELLANEOUS) ×1
CONT SPEC 4OZ STRL OR WHT (MISCELLANEOUS) ×1
CONTAINER SPEC 2.5X3XGRAD LEK (MISCELLANEOUS) IMPLANT
DRAPE UTILITY 15X26 TOWEL STRL (DRAPES) ×2 IMPLANT
DRSG TEGADERM 2-3/8X2-3/4 SM (GAUZE/BANDAGES/DRESSINGS) IMPLANT
GAUZE 4X4 16PLY ~~LOC~~+RFID DBL (SPONGE) ×4 IMPLANT
GLOVE SURG UNDER POLY LF SZ7.5 (GLOVE) ×2 IMPLANT
GOWN STRL REUS W/ TWL LRG LVL3 (GOWN DISPOSABLE) ×1 IMPLANT
GOWN STRL REUS W/ TWL XL LVL3 (GOWN DISPOSABLE) ×1 IMPLANT
GOWN STRL REUS W/TWL LRG LVL3 (GOWN DISPOSABLE) ×2
GOWN STRL REUS W/TWL XL LVL3 (GOWN DISPOSABLE) ×2
GUIDEWIRE GREEN .038 145CM (MISCELLANEOUS) ×1 IMPLANT
GUIDEWIRE STR DUAL SENSOR (WIRE) ×2 IMPLANT
INFUSOR MANOMETER BAG 3000ML (MISCELLANEOUS) ×2 IMPLANT
IV NS IRRIG 3000ML ARTHROMATIC (IV SOLUTION) ×2 IMPLANT
KIT TURNOVER CYSTO (KITS) ×2 IMPLANT
PACK CYSTO AR (MISCELLANEOUS) ×2 IMPLANT
SET CYSTO W/LG BORE CLAMP LF (SET/KITS/TRAYS/PACK) ×2 IMPLANT
SET DILATOR URETRAL 8.5FR (MISCELLANEOUS) ×1 IMPLANT
SHEATH URETERAL 12FRX35CM (MISCELLANEOUS) IMPLANT
STENT URET 6FRX24 CONTOUR (STENTS) ×2 IMPLANT
STENT URET 6FRX26 CONTOUR (STENTS) IMPLANT
SURGILUBE 2OZ TUBE FLIPTOP (MISCELLANEOUS) ×2 IMPLANT
SYR 10ML LL (SYRINGE) ×2 IMPLANT
TRACTIP FLEXIVA PULSE ID 200 (Laser) ×1 IMPLANT
VALVE UROSEAL ADJ ENDO (VALVE) ×1 IMPLANT
WATER STERILE IRR 1000ML POUR (IV SOLUTION) ×2 IMPLANT
WATER STERILE IRR 500ML POUR (IV SOLUTION) ×2 IMPLANT

## 2021-09-20 NOTE — Transfer of Care (Signed)
Immediate Anesthesia Transfer of Care Note  Patient: Kayla Weber  Procedure(s) Performed: CYSTOSCOPY/URETEROSCOPY/HOLMIUM LASER/STENT PLACEMENT (Bilateral)  Patient Location: PACU  Anesthesia Type:General  Level of Consciousness: drowsy  Airway & Oxygen Therapy: Patient Spontanous Breathing and Patient connected to nasal cannula oxygen  Post-op Assessment: Report given to RN and Post -op Vital signs reviewed and stable  Post vital signs: Reviewed and stable  Last Vitals:  Vitals Value Taken Time  BP    Temp    Pulse 68 09/20/21 1517  Resp 13 09/20/21 1517  SpO2 100 % 09/20/21 1517  Vitals shown include unvalidated device data.  Last Pain:  Vitals:   09/20/21 1301  TempSrc: Temporal  PainSc: 8          Complications: No notable events documented.

## 2021-09-20 NOTE — Progress Notes (Signed)
Pt given 12.5mg  Demerol. Around 3-4 inches above the IV site there was redness and some inflammation noted in the veins. Dr. Pernell Dupre notified. Acknowledged. No new orders. Will continue to monitor.

## 2021-09-20 NOTE — Anesthesia Procedure Notes (Signed)
Procedure Name: Intubation Date/Time: 09/20/2021 2:03 PM Performed by: Demetrius Charity, CRNA Pre-anesthesia Checklist: Patient identified, Patient being monitored, Timeout performed, Emergency Drugs available and Suction available Patient Re-evaluated:Patient Re-evaluated prior to induction Oxygen Delivery Method: Circle system utilized Preoxygenation: Pre-oxygenation with 100% oxygen Induction Type: IV induction Ventilation: Mask ventilation without difficulty Laryngoscope Size: Mac and 3 Grade View: Grade I Tube type: Oral Tube size: 7.0 mm Number of attempts: 1 Airway Equipment and Method: Stylet Placement Confirmation: ETT inserted through vocal cords under direct vision, positive ETCO2 and breath sounds checked- equal and bilateral Secured at: 20 cm Tube secured with: Tape Dental Injury: Teeth and Oropharynx as per pre-operative assessment

## 2021-09-20 NOTE — Interval H&P Note (Signed)
UROLOGY H&P UPDATE  Agree with prior H&P dated 09/18/2021.  21 year old female with 3 mm left distal ureteral stone and multiple ER visits, as well as an 8 mm right renal pelvis stone with likely intermittent obstruction on the right side.  Urinalysis relatively benign with no bacteria, and urine culture grew mixed species.  No fever or chills.  Was discharged from the ER on antibiotics.  Cardiac: RRR Lungs: CTA bilaterally  Laterality: Bilateral  Procedure: Bilateral ureteroscopy, laser lithotripsy, stent placement  We specifically discussed the risks ureteroscopy including bleeding, infection/sepsis, stent related symptoms including flank pain/urgency/frequency/incontinence/dysuria, ureteral injury, inability to access stone, or need for staged or additional procedures.   Sondra Come, MD 09/20/2021

## 2021-09-20 NOTE — Discharge Instructions (Signed)

## 2021-09-20 NOTE — Anesthesia Preprocedure Evaluation (Addendum)
Anesthesia Evaluation  Patient identified by MRN, date of birth, ID band Patient awake    Reviewed: Allergy & Precautions, H&P , NPO status , Patient's Chart, lab work & pertinent test results, reviewed documented beta blocker date and time   Airway Mallampati: II  TM Distance: >3 FB Neck ROM: full    Dental  (+) Teeth Intact   Pulmonary neg pulmonary ROS,    Pulmonary exam normal        Cardiovascular Exercise Tolerance: Good negative cardio ROS Normal cardiovascular exam Rhythm:regular Rate:Normal     Neuro/Psych Seizures -, Well Controlled,  negative neurological ROS  negative psych ROS   GI/Hepatic negative GI ROS, Neg liver ROS,   Endo/Other  negative endocrine ROS  Renal/GU Renal diseasenegative Renal ROS  negative genitourinary   Musculoskeletal   Abdominal   Peds  Hematology negative hematology ROS (+) Blood dyscrasia, anemia ,   Anesthesia Other Findings Past Medical History: No date: ADHD No date: Anemia No date: Anxiety 10/11/15: Appendicitis, acute No date: Depression No date: Inflammatory bowel disease No date: Kidney stones 06/12/2017: Mononucleosis 06/01/2017: Ovarian cyst No date: Seizures (HCC)     Comment:  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-PTS GRANDMOTHER STATES PT               ALMOST HAD ONE PRIOR TO LAST KIDNEY STONE SURGERY IN               APRIL 2018 05/2017: Urinary tract infection Past Surgical History: No date: APPENDECTOMY 08/05/2017: GANGLION CYST EXCISION; Right     Comment:  Procedure: REMOVAL GANGLION OF WRIST;  Surgeon: Deeann Saint, MD;  Location: [facility] ORS;  Service: Orthopedics;                Laterality: Right; 01/08/2017: KIDNEY STONE SURGERY 10/11/2015: LAPAROSCOPIC APPENDECTOMY; N/A     Comment:  Procedure: APPENDECTOMY LAPAROSCOPIC;  Surgeon: Lattie Haw, MD;  Location: [facility] ORS;  Service: General;                Laterality: N/A; 2014:  OTHER SURGICAL HISTORY     Comment:  Stitches under her chin  2013: TONSILLECTOMY     Comment:  [facility]   Reproductive/Obstetrics negative OB ROS                             Anesthesia Physical Anesthesia Plan  ASA: 2  Anesthesia Plan: General ETT   Post-op Pain Management:    Induction:   PONV Risk Score and Plan: 4 or greater  Airway Management Planned:   Additional Equipment:   Intra-op Plan:   Post-operative Plan:   Informed Consent: I have reviewed the patients History and Physical, chart, labs and discussed the procedure including the risks, benefits and alternatives for the proposed anesthesia with the patient or authorized representative who has indicated his/her understanding and acceptance.     Dental Advisory Given  Plan Discussed with: CRNA  Anesthesia Plan Comments: (Pt with neg urine preg dec 30 and denies any sexual activity between then and now.  She understands the risks and benefits of proceeding with GA if positive preg. Status. JA)       Anesthesia Quick Evaluation

## 2021-09-20 NOTE — Op Note (Signed)
Date of procedure: 09/20/21  Preoperative diagnosis:  Left distal ureteral stone Left renal stone Right renal stone  Postoperative diagnosis:  Same  Procedure: Cystoscopy Left ureteroscopy, laser lithotripsy of distal stone Left ureteroscopy, laser lithotripsy of renal stone Left retrograde pyelogram with intraoperative interpretation, left ureteral stent placement Right ureteroscopy, laser lithotripsy of renal stone Right retrograde pyelogram with intraoperative interpretation, right ureteral stent placement  Surgeon: Nickolas Madrid, MD  Anesthesia: General  Complications: None  Intraoperative findings:  Normal cystoscopy 50m left distal ureteral stone fragmented to dust and irrigated free, multiple small left renal stones dusted.  Left ureteral stent placed for significant left distal ureteral edema Large 8 mm right renal stone dusted, stent placed  EBL: Minimal  Specimens: None  Drains: Bilateral 6 French by 24 cm ureteral stents with Dangler's-each secured to ipsilateral groin  Indication: Kayla SPINDELis a 21y.o. patient with persistent left-sided flank pain and intermittent right-sided flank pain who was found to have multiple stones on CT and opted for ureteroscopy.  After reviewing the management options for treatment, they elected to proceed with the above surgical procedure(s). We have discussed the potential benefits and risks of the procedure, side effects of the proposed treatment, the likelihood of the patient achieving the goals of the procedure, and any potential problems that might occur during the procedure or recuperation. Informed consent has been obtained.  Description of procedure:  The patient was taken to the operating room and general anesthesia was induced. SCDs were placed for DVT prophylaxis. The patient was placed in the dorsal lithotomy position, prepped and draped in the usual sterile fashion, and preoperative antibiotics(Ancef) were  administered. A preoperative time-out was performed.   A 21 French rigid cystoscope was used to intubate the urethra and thorough cystoscopy was performed.  The bladder was grossly normal, ureteral orifices orthotopic bilaterally, there is significant edema at the left ureteral orifice.  A sensor wire was used to intubate the left ureteral orifice and advanced easily up to the kidney under fluoroscopic vision.  A semirigid short ureteroscope then advanced alongside the wire and identified a yellow 3 mm stone in the distal ureter.  Using a 240 m laser fiber on settings of 0.5 J and 80 Hz the stone was carefully dusted and all pieces irrigated free from the ureter.  A single channel digital flexible ureteroscope then advanced easily over the wire up to the kidney under fluoroscopic vision.  There was a 3 mm midpole stone in the left kidney that was fragmented to dust on previously mentioned settings.  A retrograde pyelogram for the proximal ureter showed no extravasation or filling defects.  Careful pullback ureteroscopy showed no residual fragments, and significant persistent edema in the distal ureter.  A 6 French by 24 cm ureteral stent with Dangler attached was placed with an excellent curl in the renal pelvis, as well as under direct vision in the bladder.  The Dangler was ultimately secured to the left groin.  I then turned my attention to the right side and a sensor wire passed easily into the right ureteral orifice up to the kidney under fluoroscopic vision.  I was unable to advance the single channel digital flexible and met resistance at the distal ureter.  A 8/10 French dilator in succession was used to perform gentle dilation of the ureter, and a Super Stiff wire was replaced through the 10 FPakistansheath.  At this point I was then able to easily advance the digital flexible single channel  ureteroscope up to the kidney under fluoroscopic vision.  I immediately identified a large 8 mm yellow stone  in the renal pelvis, and on previously mentioned settings this was fragmented to dust.  Thorough pyeloscopy revealed no fragments larger than 1 mm.  Retrograde pyelogram from the proximal ureter showed no extravasation or filling defects.  Careful pullback ureteroscopy showed no residual fragments or ureteral injury, and a sensor wire was replaced.  A 6 French by 24 cm ureteral stent was uneventfully placed over the wire with an excellent curl in the renal pelvis, as well as in the bladder.  The right ureteral stent Dangler was secured to the right groin with Mastisol and Tegaderm.  The bladder was drained, lidojet was instilled into the bladder, a belladonna suppository was placed, and this concluded our procedure.  Disposition: Stable to PACU  Plan: -Remove stents Tuesday 1/10, she is not comfortable removing these at home and so she will come to clinic for nursing assistance and stent removal -Recommend 76-FREV urine metabolic work-up in 6 to 8 weeks with her history of recurrent stones  Nickolas Madrid, MD

## 2021-09-23 ENCOUNTER — Encounter: Payer: Self-pay | Admitting: Urology

## 2021-09-24 ENCOUNTER — Other Ambulatory Visit: Payer: Self-pay

## 2021-09-24 ENCOUNTER — Ambulatory Visit (INDEPENDENT_AMBULATORY_CARE_PROVIDER_SITE_OTHER): Payer: Medicaid Other | Admitting: Family Medicine

## 2021-09-24 ENCOUNTER — Encounter: Payer: Self-pay | Admitting: Urology

## 2021-09-24 DIAGNOSIS — N201 Calculus of ureter: Secondary | ICD-10-CM

## 2021-09-24 NOTE — Progress Notes (Signed)
Patient presents today to have stents removed. The stents were on a string. I pulled both the Left and the Right stent. Patient tolerated well. Patient will follow up as scheduled.

## 2021-09-25 ENCOUNTER — Ambulatory Visit (INDEPENDENT_AMBULATORY_CARE_PROVIDER_SITE_OTHER): Payer: Medicaid Other | Admitting: Urology

## 2021-09-25 ENCOUNTER — Encounter: Payer: Self-pay | Admitting: Urology

## 2021-09-25 VITALS — BP 119/79 | HR 99 | Ht 63.0 in | Wt 134.0 lb

## 2021-09-25 DIAGNOSIS — N200 Calculus of kidney: Secondary | ICD-10-CM

## 2021-09-25 LAB — CALCULI, WITH PHOTOGRAPH (CLINICAL LAB)
Calcium Oxalate Dihydrate: 80 %
Calcium Oxalate Monohydrate: 10 %
Hydroxyapatite: 10 %
Weight Calculi: 2 mg

## 2021-09-25 MED ORDER — KETOROLAC TROMETHAMINE 10 MG PO TABS: 10.0000 mg | ORAL_TABLET | Freq: Four times a day (QID) | ORAL | 0 refills | Status: DC | PRN

## 2021-09-25 NOTE — Patient Instructions (Addendum)
[facility] Instructions °[facility] °  °You will receive a box/kit in the mail that will have a urine jug and instructions in the kit.  When the box arrives you will need to call our office [phone] to schedule a LAB appointment. °  °You will need to do a 24hour urine and this should be done during the days that our office will be open.  For example any day from Sunday through Thursday. °  °If you take Vitamin C 100mg or greater please stop this 5 days prior to collection. °  °How to collect the urine sample: On the day you start the urine sample this 1st morning urine should NOT be collected.  For the rest of the day including all night urines should be collected.  On the next morning the 1st urine should be collected and then you will be finished with the urine collections. °  °You will need to bring the box with you on your LAB appointment day after urine has been collected and all instructions are complete in the box.  Your blood will be drawn and the box will be collected by our Lab employee to be sent off for analysis. °  °When urine and blood is complete you will need to schedule a follow up appointment for lab results.  ° ° ° ° ° °Dietary Guidelines to Help Prevent Kidney Stones °Kidney stones are deposits of minerals and salts that form inside your kidneys. Your risk of developing kidney stones may be greater depending on your diet, your lifestyle, the medicines you take, and whether you have certain medical conditions. Most people can lower their chances of developing kidney stones by following the instructions below. Your dietitian may give you more specific instructions depending on your overall health and the type of kidney stones you tend to develop. °What are tips for following this plan? °Reading food labels ° °Choose foods with "no salt added" or "low-salt" labels. Limit your salt (sodium) intake to less than 1,500 mg a day. °Choose foods with calcium for each meal and  snack. Try to eat about 300 mg of calcium at each meal. Foods that contain 200-500 mg of calcium a serving include: °8 oz (237 mL) of milk, calcium-fortifiednon-dairy milk, and calcium-fortifiedfruit juice. Calcium-fortified means that calcium has been added to these drinks. °8 oz (237 mL) of kefir, yogurt, and soy yogurt. °4 oz (114 g) of tofu. °1 oz (28 g) of cheese. °1 cup (150 g) of dried figs. °1 cup (91 g) of cooked broccoli. °One 3 oz (85 g) can of sardines or mackerel. °Most people need 1,000-1,500 mg of calcium a day. Talk to your dietitian about how much calcium is recommended for you. °Shopping °Buy plenty of fresh fruits and vegetables. Most people do not need to avoid fruits and vegetables, even if these foods contain nutrients that may contribute to kidney stones. °When shopping for convenience foods, choose: °Whole pieces of fruit. °Pre-made salads with dressing on the side. °Low-fat fruit and yogurt smoothies. °Avoid buying frozen meals or prepared deli foods. These can be high in sodium. °Look for foods with live cultures, such as yogurt and kefir. °Choose high-fiber grains, such as whole-wheat breads, oat bran, and wheat cereals. °Cooking °Do not add salt to food when cooking. Place a salt shaker on the table and allow each person to add his or her own salt to taste. °Use vegetable protein, such as beans, textured vegetable protein (TVP), or tofu, instead of meat   in pasta, casseroles, and soups. °Meal planning °Eat less salt, if told by your dietitian. To do this: °Avoid eating processed or pre-made food. °Avoid eating fast food. °Eat less animal protein, including cheese, meat, poultry, or fish, if told by your dietitian. To do this: °Limit the number of times you have meat, poultry, fish, or cheese each week. Eat a diet free of meat at least 2 days a week. °Eat only one serving each day of meat, poultry, fish, or seafood. °When you prepare animal protein, cut pieces into small portion sizes. For  most meat and fish, one serving is about the size of the palm of your hand. °Eat at least five servings of fresh fruits and vegetables each day. To do this: °Keep fruits and vegetables on hand for snacks. °Eat one piece of fruit or a handful of berries with breakfast. °Have a salad and fruit at lunch. °Have two kinds of vegetables at dinner. °Limit foods that are high in a substance called oxalate. These include: °Spinach (cooked), rhubarb, beets, sweet potatoes, and Swiss chard. °Peanuts. °Potato chips, french fries, and baked potatoes with skin on. °Nuts and nut products. °Chocolate. °If you regularly take a diuretic medicine, make sure to eat at least 1 or 2 servings of fruits or vegetables that are high in potassium each day. These include: °Avocado. °Banana. °Orange, prune, carrot, or tomato juice. °Baked potato. °Cabbage. °Beans and split peas. °Lifestyle ° °Drink enough fluid to keep your urine pale yellow. This is the most important thing you can do. Spread your fluid intake throughout the day. °If you drink alcohol: °Limit how much you use to: °0-1 drink a day for women who are not pregnant. °0-2 drinks a day for men. °Be aware of how much alcohol is in your drink. In the [place] one drink equals one 12 oz bottle of beer (355 mL), one 5 oz glass of wine (148 mL), or one 1½ oz glass of hard liquor (44 mL). °Lose weight if told by your health care provider. Work with your dietitian to find an eating plan and weight loss strategies that work best for you. °General information °Talk to your health care provider and dietitian about taking daily supplements. You may be told the following depending on your health and the cause of your kidney stones: °Not to take supplements with vitamin C. °To take a calcium supplement. °To take a daily probiotic supplement. °To take other supplements such as magnesium, fish oil, or vitamin B6. °Take over-the-counter and prescription medicines only as told by your health care  provider. These include supplements. °What foods should I limit? °Limit your intake of the following foods, or eat them as told by your dietitian. °Vegetables °Spinach. Rhubarb. Beets. Canned vegetables. Pickles. Olives. Baked potatoes with skin. °Grains °Wheat bran. Baked goods. Salted crackers. Cereals high in sugar. °Meats and other proteins °Nuts. Nut butters. Large portions of meat, poultry, or fish. Salted, precooked, or cured meats, such as sausages, meat loaves, and hot dogs. °Dairy °Cheese. °Beverages °Regular soft drinks. Regular vegetable juice. °Seasonings and condiments °Seasoning blends with salt. Salad dressings. Soy sauce. Ketchup. Barbecue sauce. °Other foods °Canned soups. Canned pasta sauce. Casseroles. Pizza. Lasagna. Frozen meals. Potato chips. French fries. °The items listed above may not be a complete list of foods and beverages you should limit. Contact a dietitian for more information. °What foods should I avoid? °Talk to your dietitian about specific foods you should avoid based on the type of kidney stones you have   and your overall health. °Fruits °Grapefruit. °The item listed above may not be a complete list of foods and beverages you should avoid. Contact a dietitian for more information. °Summary °Kidney stones are deposits of minerals and salts that form inside your kidneys. °You can lower your risk of kidney stones by making changes to your diet. °The most important thing you can do is drink enough fluid. Drink enough fluid to keep your urine pale yellow. °Talk to your dietitian about how much calcium you should have each day, and eat less salt and animal protein as told by your dietitian. °This information is not intended to replace advice given to you by your health care provider. Make sure you discuss any questions you have with your health care provider. °Document Revised: 08/25/2019 Document Reviewed: 08/25/2019 °Elsevier Patient Education © 2022 [employer] ° °

## 2021-09-25 NOTE — Progress Notes (Signed)
° °  09/25/2021 3:09 PM   Gracy Bruins 16-Aug-2001 [id number]  Reason for visit: Follow up bilateral ureteroscopy and laser lithotripsy  HPI: 21 year old female with recurrent stones who underwent 09/20/2021 uncomplicated bilateral ureteroscopy, laser lithotripsy, and stent placement for a 3 mm left distal ureteral stone and 8 mm right renal pelvis stone.  Her stents were removed in clinic yesterday.  She had severe left-sided flank pain after her stent was removed, but that has resolved today.  She denies any complaints today, but she made the appointment because she was worried she could potentially have another stone.  We discussed she likely had some ureteral spasm/ureteral edema on the left side that has now resolved.  I recommended Toradol for any recurrence of her pain.  She denies any UTI symptoms of dysuria or fever that would warrant further lab work or imaging.  We discussed general stone prevention strategies including adequate hydration with goal of producing 2.5 L of urine daily, increasing citric acid intake, increasing calcium intake during high oxalate meals, minimizing animal protein, and decreasing salt intake. Information about dietary recommendations given today.   Stone type was calcium oxalate RTC 8 weeks with 24-hour urine prior   Sondra Come, MD  [facility] [address] [place], [place] [address] [phone]

## 2021-10-15 NOTE — Anesthesia Postprocedure Evaluation (Signed)
Anesthesia Post Note  Patient: Kayla Weber  Procedure(s) Performed: CYSTOSCOPY/URETEROSCOPY/HOLMIUM LASER/STENT PLACEMENT (Bilateral)  Patient location during evaluation: PACU Anesthesia Type: General Level of consciousness: awake and alert Pain management: pain level controlled Vital Signs Assessment: post-procedure vital signs reviewed and stable Respiratory status: spontaneous breathing, nonlabored ventilation, respiratory function stable and patient connected to nasal cannula oxygen Cardiovascular status: blood pressure returned to baseline and stable Postop Assessment: no apparent nausea or vomiting Anesthetic complications: no   No notable events documented.   Last Vitals:  Vitals:   09/20/21 1632 09/20/21 1702  BP: 124/86 120/88  Pulse: 69 81  Resp: 18 18  Temp: (!) 36.4 C   SpO2: 99% 98%    Last Pain:  Vitals:   09/20/21 1702  TempSrc:   PainSc: 5                  Yevette Edwards

## 2021-11-20 ENCOUNTER — Ambulatory Visit: Payer: Medicaid Other | Admitting: Urology

## 2021-11-27 ENCOUNTER — Encounter: Payer: Self-pay | Admitting: Advanced Practice Midwife

## 2021-11-27 ENCOUNTER — Other Ambulatory Visit: Payer: Self-pay

## 2021-11-27 ENCOUNTER — Ambulatory Visit (INDEPENDENT_AMBULATORY_CARE_PROVIDER_SITE_OTHER): Payer: Medicaid Other | Admitting: Advanced Practice Midwife

## 2021-11-27 VITALS — BP 120/81 | HR 81 | Ht 64.0 in | Wt 132.0 lb

## 2021-11-27 DIAGNOSIS — N939 Abnormal uterine and vaginal bleeding, unspecified: Secondary | ICD-10-CM

## 2021-11-27 DIAGNOSIS — Z30430 Encounter for insertion of intrauterine contraceptive device: Secondary | ICD-10-CM

## 2021-11-27 MED ORDER — METHYLERGONOVINE MALEATE 0.2 MG PO TABS: 0.2000 mg | ORAL_TABLET | Freq: Three times a day (TID) | ORAL | 0 refills | Status: AC

## 2021-11-27 NOTE — Progress Notes (Signed)
? ?GYNECOLOGY OFFICE PROCEDURE NOTE ? ?Kayla Weber is a 21 y.o. G0P0000 here for birth control consult and Kyleena IUD insertion. Her main concern is irregular and prolonged menstrual bleeding. 4 months ago she started OCP (high dose) for ongoing concerns of irregular bleeding. She reports that helped for the first 3 months and most recently she has been bleeding for the past 2 weeks. The bleeding is red and generally light in flow. We discussed the various options and after discussion she chooses [place] IUD. She is aware of potential side effects. Patient's last menstrual period was 11/06/2021 (approximate)..  The indication for her IUD is contraception/cycle control. She does have an appointment with Hematology to assess blood clotting factors.  ? ?Review of Systems  ?Constitutional:  Negative for chills and fever.  ?HENT:  Negative for congestion, ear discharge, ear pain, hearing loss, sinus pain and sore throat.   ?Eyes:  Negative for blurred vision and double vision.  ?Respiratory:  Negative for cough, shortness of breath and wheezing.   ?Cardiovascular:  Negative for chest pain, palpitations and leg swelling.  ?Gastrointestinal:  Negative for abdominal pain, blood in stool, constipation, diarrhea, heartburn, melena, nausea and vomiting.  ?Genitourinary:  Negative for dysuria, flank pain, frequency, hematuria and urgency.  ?Musculoskeletal:  Negative for back pain, joint pain and myalgias.  ?Skin:  Negative for itching and rash.  ?Neurological:  Negative for dizziness, tingling, tremors, sensory change, speech change, focal weakness, seizures, loss of consciousness, weakness and headaches.  ?Endo/Heme/Allergies:  Negative for environmental allergies. Does not bruise/bleed easily.  ?     Positive for prolonged menstrual bleeding  ?Psychiatric/Behavioral:  Negative for depression, hallucinations, memory loss, substance abuse and suicidal ideas. The patient is not nervous/anxious and does not have insomnia.    ? ?Vital Signs: BP 120/81 (Cuff Size: Normal)   Pulse 81   Ht 5\' 4"  (1.626 m)   Wt 132 lb (59.9 kg)   LMP 11/06/2021 (Approximate)   BMI 22.66 kg/m?  ?Constitutional: Well nourished, well developed female in no acute distress.  ?HEENT: normal ?Skin: Warm and dry.   ?Extremity:  no edema   ?Respiratory: Clear to auscultation bilateral. Normal respiratory effort ?Neuro: DTRs 2+, Cranial nerves grossly intact ?Psych: Alert and Oriented x3. No memory deficits. Normal mood and affect.  ? ?Pelvic exam: (female chaperone present) ?is not limited by body habitus ?EGBUS: within normal limits ?Vagina: within normal limits and with normal mucosa blood in the vault ?Cervix: grossly normal appearance ? ? ?IUD Insertion Procedure Note ?Patient identified, informed consent performed, consent signed.   Discussed risks of irregular bleeding, cramping, infection, malpositioning, expulsion or uterine perforation of the IUD (1:1000 placements)  which may require further procedure such as laparoscopy.  IUD while effective at preventing pregnancy do not prevent transmission of sexually transmitted diseases and use of barrier methods for this purpose was discussed. Time out was performed.   ? ?Speculum placed in the vagina.  Cervix visualized.  Cleaned with Betadine x 3.  Grasped posteriorly with a single tooth tenaculum.  Uterus sounded to 7 cm. IUD placed per manufacturer's recommendations.  Strings trimmed to 3 cm. Tenaculum was removed, good hemostasis noted.  Patient tolerated procedure well.  ? ?Patient was given post-procedure instructions.  She was advised to have backup contraception for one week.  Patient was also asked to check IUD strings periodically and follow up in 4-6 weeks for IUD check. ? ?21 y.o. G0 s/p 12 IUD insertion ?Rx Methergine PO TID x  3 days ? ? ?IUD insertion CPT 58300,  ?Skyla [id number] ?Mirena [phone] ?Liletta [id number] ?Paraguard [id number] ?Rutha Bouchard [id number] ?Modifer 25, plus Modifer 79 is done during a global  billing visit  ? ? ?Tresea Mall, CNM ?[facility] ?[facility]  ?11/27/2021, 3:06 PM ? ?

## 2021-11-27 NOTE — Patient Instructions (Signed)

## 2021-12-24 ENCOUNTER — Ambulatory Visit: Payer: Medicaid Other | Admitting: Advanced Practice Midwife

## 2021-12-30 ENCOUNTER — Ambulatory Visit: Payer: Medicaid Other | Admitting: Dietician

## 2021-12-30 DIAGNOSIS — D6804 Acquired von Willebrand disease: Secondary | ICD-10-CM | POA: Insufficient documentation

## 2022-01-06 ENCOUNTER — Encounter: Payer: Self-pay | Admitting: Advanced Practice Midwife

## 2022-01-06 ENCOUNTER — Ambulatory Visit (INDEPENDENT_AMBULATORY_CARE_PROVIDER_SITE_OTHER): Payer: Medicaid Other | Admitting: Advanced Practice Midwife

## 2022-01-06 VITALS — BP 120/80 | Ht 62.9 in | Wt 134.0 lb

## 2022-01-06 DIAGNOSIS — Z30431 Encounter for routine checking of intrauterine contraceptive device: Secondary | ICD-10-CM

## 2022-01-06 NOTE — Progress Notes (Signed)
? ? ? ?Obstetrics & Gynecology Office Visit  ? ?Chief Complaint:  ?Chief Complaint  ?Patient presents with  ? Follow-up  ? ? ?History of Present Illness: 21 y.o. patient presenting for follow up of Kayla Weber IUD placement 5 weeks ago.  The indication for her IUD was cycle control.  She denies any complications since her IUD placement.  She is having a lighter than usual period. Her cramping is slightly worse than previously. She mentions an odor associated with her cycle and denies itching or irritation. Reviewed normal and recommended boric acid suppository or apple cider vinegar tub soaks as needed and return for worsening.   She has not tried to feel for strings.   ? 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 Her Ferritin level was also low and she is taking iron supplement. ? ?Review of Systems: Review of Systems  ?Constitutional:  Negative for chills and fever.  ?HENT:  Negative for congestion, ear discharge, ear pain, hearing loss, sinus pain and sore throat.   ?Eyes:  Negative for blurred vision and double vision.  ?Respiratory:  Negative for cough, shortness of breath and wheezing.   ?Cardiovascular:  Negative for chest pain, palpitations and leg swelling.  ?Gastrointestinal:  Positive for abdominal pain. Negative for blood in stool, constipation, diarrhea, heartburn, melena, nausea and vomiting.  ?Genitourinary:  Negative for dysuria, flank pain, frequency, hematuria and urgency.  ?     Positive for vaginal odor associated with period  ?Musculoskeletal:  Negative for back pain, joint pain and myalgias.  ?Skin:  Negative for itching and rash.  ?Neurological:  Negative for dizziness, tingling, tremors, sensory change, speech change, focal weakness, seizures, loss of consciousness, weakness and headaches.  ?Endo/Heme/Allergies:  Negative for environmental allergies. Does not bruise/bleed easily.   ?Psychiatric/Behavioral:  Negative for depression, hallucinations, memory loss, substance abuse and suicidal ideas. The patient is not nervous/anxious and does not have insomnia.   ? ?Past Medical History:  ?Past Medical History:  ?Diagnosis Date  ? ADHD   ? Anemia   ? Anxiety   ? Appendicitis, acute 10/11/15  ? Depression   ? Inflammatory bowel disease   ? Kidney stones   ? Mononucleosis 06/12/2017  ? Ovarian cyst 06/01/2017  ? Seizures (HCC)   ? 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 Urinary tract infection 05/2017  ? ? ?Past Surgical History:  ?Past Surgical History:  ?Procedure Laterality Date  ? APPENDECTOMY    ? CYSTOSCOPY/URETEROSCOPY/HOLMIUM LASER/STENT PLACEMENT Bilateral 09/20/2021  ? Procedure: CYSTOSCOPY/URETEROSCOPY/HOLMIUM LASER/STENT PLACEMENT;  Surgeon: Sondra Come, MD;  Location: [facility];  Service: Urology;  Laterality: Bilateral;  ? GANGLION CYST EXCISION Right 08/05/2017  ? Procedure: REMOVAL GANGLION OF WRIST;  Surgeon: Deeann Saint, MD;  Location: [facility];  Service: Orthopedics;  Laterality: Right;  ? KIDNEY STONE SURGERY  01/08/2017  ? LAPAROSCOPIC APPENDECTOMY N/A 10/11/2015  ? Procedure: APPENDECTOMY LAPAROSCOPIC;  Surgeon: Lattie Haw, MD;  Location: [facility];  Service: General;  Laterality: N/A;  ? OTHER SURGICAL HISTORY  2014  ? Stitches under her chin   ? TONSILLECTOMY  2013  ? [facility]  ? ? ?Gynecologic History: Patient's last menstrual period was 01/04/2022. ? ?Obstetric History: G0P0000 ? ?Family History:  ?Family History  ?Problem Relation Age of Onset  ? Seizures Mother   ?  As an adolescent and currently takes medication for seizures  ? Seizures Maternal Uncle   ?     At the age of 3 but did not have many afterwards and never had to be placed on medication as a child or adult.  ? ? ?Social History:  ?Social History  ? ?Socioeconomic History  ? Marital status: Single  ?  Spouse name: Not on  file  ? Number of children: Not on file  ? Years of education: Not on file  ? Highest education level: Not on file  ?Occupational History  ? Not on file  ?Tobacco Use  ? Smoking status: Never  ? Smokeless tobacco: Never  ?Vaping Use  ? Vaping Use: Never used  ?Substance and Sexual Activity  ? Alcohol use: No  ? Drug use: No  ? Sexual activity: Not Currently  ?  Birth control/protection: None  ?Other Topics Concern  ? Not on file  ?Social History Narrative  ? Kayla Weber is a 9th grade student.  ? She attends [employer].  ? She lives with her grandparents.  ? She enjoys music and playing games.  ? ?Social Determinants of Health  ? ?Financial Resource Strain: Not on file  ?Food Insecurity: Not on file  ?Transportation Needs: Not on file  ?Physical Activity: Not on file  ?Stress: Not on file  ?Social Connections: Not on file  ?Intimate Partner Violence: Not on file  ? ? ?Allergies:  ?Allergies  ?Allergen Reactions  ? Amoxicillin-Pot Clavulanate Hives, Itching and Swelling  ?  "my fingers and face started to swell and I got really red and hot", rash appears on body   ? Ciprofloxacin Rash  ? ? ?Medications: ?Prior to Admission medications   ?Medication Sig Start Date End Date Taking? Authorizing Provider  ?buPROPion (WELLBUTRIN XL) 150 MG 24 hr tablet Take 150 mg by mouth daily. 04/18/20  Yes [provider]  ?Cetirizine HCl 10 MG CAPS Take 10 mg by mouth daily as needed (allergies).   Yes [provider]  ?chlorhexidine (PERIDEX) 0.12 % solution  10/07/21  Yes [provider]  ?cyclobenzaprine (FLEXERIL) 10 MG tablet Take 10 mg by mouth every 8 (eight) hours as needed. 10/23/21  Yes [provider]  ?FLUoxetine (PROZAC) 20 MG capsule Take 20 mg every morning by mouth.  10/30/16  Yes [provider]  ?gabapentin (NEURONTIN) 300 MG capsule Take 300 mg by mouth at bedtime. 07/17/20  Yes [provider]  ?levonorgestrel (Kayla Weber) 19.5 MG IUD 1 each by Intrauterine route once.  11/27/21 11/28/26 Yes Tresea Mall, CNM  ?lidocaine-prilocaine (EMLA) cream Apply 1 application topically as needed. 08/17/20  Yes Tresea Mall, CNM  ?ondansetron (ZOFRAN-ODT) 4 MG disintegrating tablet Take 1 tablet (4 mg total) by mouth every 8 (eight) hours as needed for nausea or vomiting. 09/13/21  Yes Gilles Chiquito, MD  ?traZODone (DESYREL) 50 MG tablet Take 50 mg by mouth at bedtime.   Yes [provider]  ?triamcinolone (KENALOG) 0.1 % 1 application daily as needed (skin). 07/18/20  Yes [provider]  ? ? ?Physical Exam ?Blood pressure 120/80, height 5' 2.9" (1.598 m), weight 134 lb (60.8 kg), last menstrual period 01/04/2022. ?Patient's last menstrual period was 01/04/2022. ? ?General: NAD ?HEENT: normocephalic, anicteric ?Pulmonary: No increased work of breathing ? ?Genitourinary: ? External: Normal external female genitalia.  Normal urethral meatus, normal  Bartholin's and Skene's glands.   ? Vagina: Normal vaginal mucosa, no evidence of prolapse.   ? Cervix:  Grossly normal in appearance, no bleeding, IUD strings visualized 2cm ? Uterus: Non-enlarged, mobile, normal contour.  No CMT ? Adnexa: ovaries non-enlarged, no adnexal masses ? Rectal: deferred ? Lymphatic: no evidence of inguinal lymphadenopathy ?Extremities: no edema, erythema, or tenderness ?Neurologic: Grossly intact ?Psychiatric: mood appropriate, affect full ? ? ?Assessment: 21 y.o. G0P0000 IUD string check with normal findings ? ? ?Plan: ?Problem List Items Addressed This Visit   ?None ?Visit Diagnoses   ? ? IUD check up    -  Primary  ? ?  ? ? ? ?1.  The patient was given instructions to check her IUD strings monthly and call with any problems or concerns.  She should call for fevers, chills, abnormal vaginal discharge, pelvic pain, or other complaints. ? ?2.   IUDs while effective at preventing pregnancy do not prevent transmission of sexually transmitted diseases and use of barrier methods for this purpose was  discussed.  Low overall incidence of failure with 99.7% efficacy rate in typical use.  The patient has not contraindication to IUD placement. ? ?3.  She will return for a annual exam in 1 year.  All questions answered. ? ?4) A t

## 2022-01-09 ENCOUNTER — Encounter: Payer: Self-pay | Admitting: Dietician

## 2022-01-09 NOTE — Progress Notes (Signed)
Patient did not keep her initial MNT visit scheduled for 12/30/21. Sent notification to referring provider. ?

## 2022-02-09 ENCOUNTER — Other Ambulatory Visit: Payer: Self-pay

## 2022-02-09 ENCOUNTER — Emergency Department
Admission: EM | Admit: 2022-02-09 | Discharge: 2022-02-09 | Disposition: A | Discharge status: Home / Self Care | Payer: Medicaid Other | Attending: Emergency Medicine | Admitting: Emergency Medicine

## 2022-02-09 ENCOUNTER — Emergency Department: Payer: Medicaid Other

## 2022-02-09 DIAGNOSIS — W01198A Fall on same level from slipping, tripping and stumbling with subsequent striking against other object, initial encounter: Secondary | ICD-10-CM | POA: Insufficient documentation

## 2022-02-09 DIAGNOSIS — R55 Syncope and collapse: Secondary | ICD-10-CM | POA: Diagnosis not present

## 2022-02-09 DIAGNOSIS — S0101XA Laceration without foreign body of scalp, initial encounter: Secondary | ICD-10-CM | POA: Diagnosis not present

## 2022-02-09 DIAGNOSIS — S06321A Contusion and laceration of left cerebrum with loss of consciousness of 30 minutes or less, initial encounter: Secondary | ICD-10-CM | POA: Diagnosis not present

## 2022-02-09 DIAGNOSIS — Y990 Civilian activity done for income or pay: Secondary | ICD-10-CM | POA: Diagnosis not present

## 2022-02-09 DIAGNOSIS — S062X1A Diffuse traumatic brain injury with loss of consciousness of 30 minutes or less, initial encounter: Secondary | ICD-10-CM

## 2022-02-09 DIAGNOSIS — S0990XA Unspecified injury of head, initial encounter: Secondary | ICD-10-CM | POA: Diagnosis present

## 2022-02-09 DIAGNOSIS — S060X1A Concussion with loss of consciousness of 30 minutes or less, initial encounter: Secondary | ICD-10-CM

## 2022-02-09 DIAGNOSIS — N390 Urinary tract infection, site not specified: Secondary | ICD-10-CM

## 2022-02-09 LAB — COMPREHENSIVE METABOLIC PANEL
ALT: 13 U/L (ref 0–44)
AST: 18 U/L (ref 15–41)
Albumin: 4.5 g/dL (ref 3.5–5.0)
Alkaline Phosphatase: 46 U/L (ref 38–126)
Anion gap: 5 (ref 5–15)
BUN: 11 mg/dL (ref 6–20)
CO2: 25 mmol/L (ref 22–32)
Calcium: 9.3 mg/dL (ref 8.9–10.3)
Chloride: 106 mmol/L (ref 98–111)
Creatinine, Ser: 0.56 mg/dL (ref 0.44–1.00)
GFR, Estimated: >60 mL/min (ref >60)
Glucose, Bld: 104 mg/dL — ABNORMAL HIGH (ref 70–99)
Potassium: 3.6 mmol/L (ref 3.5–5.1)
Sodium: 136 mmol/L (ref 135–145)
Total Bilirubin: 0.8 mg/dL (ref 0.3–1.2)
Total Protein: 7.6 g/dL (ref 6.5–8.1)

## 2022-02-09 LAB — CBC WITH DIFFERENTIAL/PLATELET
Abs Immature Granulocytes: 0.01 10*3/uL (ref 0.00–0.07)
Basophils Absolute: 0.1 10*3/uL (ref 0.0–0.1)
Basophils Relative: 1 %
Eosinophils Absolute: 0.5 10*3/uL (ref 0.0–0.5)
Eosinophils Relative: 8 %
HCT: 40.3 % (ref 36.0–46.0)
Hemoglobin: 13 g/dL (ref 12.0–15.0)
Immature Granulocytes: 0 %
Lymphocytes Relative: 46 %
Lymphs Abs: 2.6 10*3/uL (ref 0.7–4.0)
MCH: 28.1 pg (ref 26.0–34.0)
MCHC: 32.3 g/dL (ref 30.0–36.0)
MCV: 87.2 fL (ref 80.0–100.0)
Monocytes Absolute: 0.4 10*3/uL (ref 0.1–1.0)
Monocytes Relative: 7 %
Neutro Abs: 2.1 10*3/uL (ref 1.7–7.7)
Neutrophils Relative %: 38 %
Platelets: 219 10*3/uL (ref 150–400)
RBC: 4.62 MIL/uL (ref 3.87–5.11)
RDW: 13.4 % (ref 11.5–15.5)
WBC: 5.6 10*3/uL (ref 4.0–10.5)
nRBC: 0 % (ref 0.0–0.2)

## 2022-02-09 LAB — URINE DRUG SCREEN, QUALITATIVE (ARMC ONLY)
Amphetamines, Ur Screen: NOT DETECTED
Barbiturates, Ur Screen: NOT DETECTED
Benzodiazepine, Ur Scrn: NOT DETECTED
Cannabinoid 50 Ng, Ur ~~LOC~~: NOT DETECTED
Cocaine Metabolite,Ur ~~LOC~~: NOT DETECTED
MDMA (Ecstasy)Ur Screen: NOT DETECTED
Methadone Scn, Ur: NOT DETECTED
Opiate, Ur Screen: NOT DETECTED
Phencyclidine (PCP) Ur S: NOT DETECTED
Tricyclic, Ur Screen: NOT DETECTED

## 2022-02-09 LAB — URINALYSIS, ROUTINE W REFLEX MICROSCOPIC
Bilirubin Urine: NEGATIVE
Glucose, UA: NEGATIVE mg/dL
Ketones, ur: 5 mg/dL — AB
Leukocytes,Ua: NEGATIVE
Nitrite: NEGATIVE
Protein, ur: NEGATIVE mg/dL
Specific Gravity, Urine: 1.01 (ref 1.005–1.030)
pH: 8 (ref 5.0–8.0)

## 2022-02-09 LAB — LACTIC ACID, PLASMA: Lactic Acid, Venous: 1.8 mmol/L (ref 0.5–1.9)

## 2022-02-09 MED ORDER — ONDANSETRON HCL 4 MG/2ML IJ SOLN
4.0000 mg | Freq: Once | INTRAMUSCULAR | Status: AC
  Administered 2022-02-09: 4 mg via INTRAVENOUS
  Filled 2022-02-09: qty 2

## 2022-02-09 MED ORDER — ONDANSETRON 8 MG PO TBDP: 8.0000 mg | ORAL_TABLET | Freq: Three times a day (TID) | ORAL | 0 refills | Status: DC | PRN

## 2022-02-09 MED ORDER — ACETAMINOPHEN 325 MG PO TABS
650.0000 mg | ORAL_TABLET | Freq: Once | ORAL | Status: DC
  Filled 2022-02-09: qty 2

## 2022-02-09 MED ORDER — LACTATED RINGERS IV BOLUS
1000.0000 mL | Freq: Once | INTRAVENOUS | Status: AC
  Administered 2022-02-09: 1000 mL via INTRAVENOUS

## 2022-02-09 MED ORDER — KETOROLAC TROMETHAMINE 30 MG/ML IJ SOLN
30.0000 mg | Freq: Once | INTRAMUSCULAR | Status: AC
  Administered 2022-02-09: 30 mg via INTRAVENOUS
  Filled 2022-02-09: qty 1

## 2022-02-09 MED ORDER — FOSFOMYCIN TROMETHAMINE 3 G PO PACK
3.0000 g | PACK | Freq: Once | ORAL | Status: AC
  Administered 2022-02-09: 3 g via ORAL
  Filled 2022-02-09: qty 3

## 2022-02-09 NOTE — ED Notes (Signed)
Pt ambulatory to toilet

## 2022-02-09 NOTE — ED Notes (Signed)
Pt sitting up in bed, endorsing small headache at this time

## 2022-02-09 NOTE — ED Notes (Signed)
Pt vomitting, pt given yaunker and instructed on use.

## 2022-02-09 NOTE — ED Notes (Addendum)
This Rn noted that patient was laying on side, vomiting while leaving the room with CT. PT reminded they are on c-spine precautions at this time. Pt has pillow supporting back at this time. Pt to CT

## 2022-02-09 NOTE — ED Notes (Signed)
PT is refusing urine pregnancy test at this time. PT stating she has not has intercourse since December and is on the IUD method of BCP

## 2022-02-09 NOTE — ED Notes (Signed)
Dc ppw provided. Pt questions answered.pt declines vs at time of dc. Pt verbalized consent for dc at this time. Pt to lobby in wheelchair with family alert and oriented x4

## 2022-02-09 NOTE — ED Provider Notes (Addendum)
[facility] Provider Note   Event Date/Time   First MD Initiated Contact with Patient 02/09/22 1335     (approximate) History  Loss of Consciousness and Seizures  HPI Kayla Weber is a 21 y.o. female with stated past medical history of epilepsy who presents after a head injury to the left temple from a mechanical fall while training at her job.  The patient reportedly hit the metal portion of a garage door.  Patient reportedly had 3-4 minutes of seizure-like activity after the fall.  EMS noted patient to be at baseline upon their arrival approximately 2 minutes after the seizure-like activity.  Patient has been alert, oriented x3, and having no new complaints other than headache since she was found at her job.  Patient currently denies any vision changes, tinnitus, difficulty speaking, facial droop, sore throat, chest pain, shortness of breath, abdominal pain, nausea/vomiting/diarrhea, dysuria, or weakness/numbness/paresthesias in any extremity Physical Exam  Triage Vital Signs: ED Triage Vitals  Enc Vitals Group     BP 02/09/22 1334 119/80     Pulse Rate 02/09/22 1334 82     Resp 02/09/22 1333 19     Temp 02/09/22 1334 98.6 F (37 C)     Temp Source 02/09/22 1334 Oral     SpO2 02/09/22 1333 95 %     Weight 02/09/22 1334 128 lb (58.1 kg)     Height --      Head Circumference --      Peak Flow --      Pain Score 02/09/22 1333 5     Pain Loc --      Pain Edu? --      Excl. in GC? --    Most recent vital signs: Vitals:   02/09/22 1400 02/09/22 1430  BP: 106/79 110/76  Pulse: 83 68  Resp: 12 14  Temp:    SpO2: 100% 100%   General: Awake, oriented x4.  C-collar in place CV:  Good peripheral perfusion.  Resp:  Normal effort.  Abd:  No distention.  Other:  Small subcentimeter superficial laceration to the left temple that is hemostatic ED Results / Procedures / Treatments  Labs (all labs ordered are listed, but only abnormal results are  displayed) Labs Reviewed  COMPREHENSIVE METABOLIC PANEL - Abnormal; Notable for the following components:      Result Value   Glucose, Bld 104 (*)    All other components within normal limits  LACTIC ACID, PLASMA  CBC WITH DIFFERENTIAL/PLATELET  LACTIC ACID, PLASMA  URINALYSIS, ROUTINE W REFLEX MICROSCOPIC  URINE DRUG SCREEN, QUALITATIVE ([facility] ONLY)  POC URINE PREG, ED   EKG ED ECG REPORT I, Merwyn KatosEvan K Ponciano Shealy, the attending physician, personally viewed and interpreted this ECG. Date: 02/09/2022 EKG Time: 1333 Rate: 95 Rhythm: normal sinus rhythm QRS Axis: normal Intervals: normal ST/T Wave abnormalities: normal Narrative Interpretation: no evidence of acute ischemia RADIOLOGY ED MD interpretation: CT of the head without contrast interpreted by me shows no evidence of acute abnormalities including no intracerebral hemorrhage, obvious masses, or significant edema  CT of the cervical spine interpreted by me does not show any evidence of acute abnormalities including no acute fracture, malalignment, height loss, or dislocation  -Agree with radiology assessment Official radiology report(s): CT Head Wo Contrast  Result Date: 02/09/2022 CLINICAL DATA:  Head and neck trauma, possible seizure, syncopal event EXAM: CT HEAD WITHOUT CONTRAST CT CERVICAL SPINE WITHOUT CONTRAST TECHNIQUE: Multidetector CT imaging of the head and cervical spine  was performed following the standard protocol without intravenous contrast. Multiplanar CT image reconstructions of the cervical spine were also generated. RADIATION DOSE REDUCTION: This exam was performed according to the departmental dose-optimization program which includes automated exposure control, adjustment of the mA and/or kV according to patient size and/or use of iterative reconstruction technique. COMPARISON:  None Available. FINDINGS: CT HEAD FINDINGS Brain: No evidence of acute infarction, hemorrhage, hydrocephalus, extra-axial collection or mass  lesion/mass effect. Vascular: No hyperdense vessel or unexpected calcification. Skull: Normal. Negative for fracture or focal lesion. Sinuses/Orbits: No acute finding. Other: Soft tissue contusion over the left temple (series 2, image 14). CT CERVICAL SPINE FINDINGS Alignment: Normal. Skull base and vertebrae: No acute fracture. No primary bone lesion or focal pathologic process. Soft tissues and spinal canal: No prevertebral fluid or swelling. No visible canal hematoma. Disc levels:  Intact. Upper chest: Negative. Other: None. IMPRESSION: 1. No acute intracranial pathology. 2. No fracture or static subluxation of the cervical spine. 3. Soft tissue contusion over the left temple. Electronically Signed   By: Jearld Lesch M.D.   On: 02/09/2022 14:36   CT Cervical Spine Wo Contrast  Result Date: 02/09/2022 CLINICAL DATA:  Head and neck trauma, possible seizure, syncopal event EXAM: CT HEAD WITHOUT CONTRAST CT CERVICAL SPINE WITHOUT CONTRAST TECHNIQUE: Multidetector CT imaging of the head and cervical spine was performed following the standard protocol without intravenous contrast. Multiplanar CT image reconstructions of the cervical spine were also generated. RADIATION DOSE REDUCTION: This exam was performed according to the departmental dose-optimization program which includes automated exposure control, adjustment of the mA and/or kV according to patient size and/or use of iterative reconstruction technique. COMPARISON:  None Available. FINDINGS: CT HEAD FINDINGS Brain: No evidence of acute infarction, hemorrhage, hydrocephalus, extra-axial collection or mass lesion/mass effect. Vascular: No hyperdense vessel or unexpected calcification. Skull: Normal. Negative for fracture or focal lesion. Sinuses/Orbits: No acute finding. Other: Soft tissue contusion over the left temple (series 2, image 14). CT CERVICAL SPINE FINDINGS Alignment: Normal. Skull base and vertebrae: No acute fracture. No primary bone lesion or  focal pathologic process. Soft tissues and spinal canal: No prevertebral fluid or swelling. No visible canal hematoma. Disc levels:  Intact. Upper chest: Negative. Other: None. IMPRESSION: 1. No acute intracranial pathology. 2. No fracture or static subluxation of the cervical spine. 3. Soft tissue contusion over the left temple. Electronically Signed   By: Jearld Lesch M.D.   On: 02/09/2022 14:36   PROCEDURES: Critical Care performed: No .1-3 Lead EKG Interpretation Performed by: Merwyn Katos, MD Authorized by: Merwyn Katos, MD     Interpretation: normal     ECG rate:  69   ECG rate assessment: normal     Rhythm: sinus rhythm     Ectopy: none     Conduction: normal   MEDICATIONS ORDERED IN ED: Medications  acetaminophen (TYLENOL) tablet 650 mg (650 mg Oral Patient Refused/Not Given 02/09/22 1456)  ketorolac (TORADOL) 30 MG/ML injection 30 mg (30 mg Intravenous Given 02/09/22 1459)  ondansetron (ZOFRAN) injection 4 mg (4 mg Intravenous Given 02/09/22 1459)   IMPRESSION / MDM / ASSESSMENT AND PLAN / ED COURSE  I reviewed the triage vital signs and the nursing notes.                             The patient is on the cardiac monitor to evaluate for evidence of arrhythmia and/or significant heart rate changes.  Patient presenting with head trauma.  Patient's neurological exam was non-focal and unremarkable.  [place] Head CT Rule was applied and patient did not fall into the low risk category so a head CT was obtained.  This showed no significant findings.  At this time, it is felt that the most likely explanation for the patient's symptoms is concussion.   I also considered SAH, SDH, Epidural Hematoma, IPH, skull fracture, migraine but this appears less likely considering the data gathered thus far.   Patient provided wound cleansing and care.   Patient remained stable and neurologically intact while in the emergency department.  Discussed warning signs that would prompt return to ED.  Head  trauma handout was provided.  Discussed in detail concussion management.  No sports or strenuous activity until symptoms free.  Return to emergency department urgently if new or worsening symptoms develop.    Impression:  Concussion with loss of consciousness Left scalp laceration Syncope  Plan  Discharge from ED Tylenol for pain control. Avoid aspirin, NSAIDs, or other blood thinners. Advised patient on supportive measures for cognitive rest - avoid use of cognitive function for at least 24 hours.  This means no tv, books, texting, computers, etc. Limit visitors to the house.  Head trauma instructions provided in discharge instructions Instructed Pt to monitor for neurologic symptoms, severe HA, change in mental status, seizures, loss of conciousness. Instructed Pt to f/up w/ PCP in 5 days or ETC should symptoms worsen or not improve. Pt verbally expressed understanding and all questions were addressed to Pt's satisfaction.    FINAL CLINICAL IMPRESSION(S) / ED DIAGNOSES   Final diagnoses:  Syncope and collapse  Concussion with loss of consciousness of 30 minutes or less, initial encounter  Laceration and contusion of cerebral cortex, left, with loss of consciousness of 30 minutes or less, initial encounter (HCC)   Rx / DC Orders   ED Discharge Orders          Ordered    ondansetron (ZOFRAN-ODT) 8 MG disintegrating tablet  Every 8 hours PRN        02/09/22 1500           Note:  This document was prepared using Dragon voice recognition software and may include unintentional dictation errors.   Merwyn Katos, MD 02/09/22 1501    Merwyn Katos, MD 02/09/22 1501

## 2022-02-09 NOTE — Discharge Instructions (Addendum)
Please follow the instructions for the traumatic brain injury that we gave you.  Return for worsening headache or repeated vomiting not controlled by the Zofran.  He will need to stay home and not work for couple days.  Rest and take it easy you do not have to lay in bed but do not do anything strenuous either mental or physical.  You should not ought to search the web or play any online games either.  Please follow-up with your primary care doctor by Tuesday.  I have given you a work note till then.    You do have a UTI.  I gave you a dose of a medicine called fosfomycin here in the emergency department.  It will cure a mild UTI most of the time.  If you are still having symptoms on Tuesday get your doctor to recheck your urine.

## 2022-02-09 NOTE — ED Notes (Signed)
PT given warm blankets 

## 2022-02-09 NOTE — ED Triage Notes (Signed)
Pt arrives from work today with [employer] SP syncopal  event causing pt to hit head on metal hanger. Pt was witnessed possible sx activity. Pt pmh of sz as a kid, no longer being treated for sz or taking medication. Pt arrives alert with collar. VSS enroute. 160/100 bp by EMS. Cbg 109.Pt has lac on L Head.

## 2022-09-21 ENCOUNTER — Other Ambulatory Visit: Payer: Self-pay

## 2022-09-21 ENCOUNTER — Emergency Department
Admission: EM | Admit: 2022-09-21 | Discharge: 2022-09-21 | Disposition: A | Discharge status: Home / Self Care | Payer: Medicaid Other | Attending: Emergency Medicine | Admitting: Emergency Medicine

## 2022-09-21 ENCOUNTER — Emergency Department: Payer: Medicaid Other

## 2022-09-21 DIAGNOSIS — R109 Unspecified abdominal pain: Secondary | ICD-10-CM

## 2022-09-21 DIAGNOSIS — R1030 Lower abdominal pain, unspecified: Secondary | ICD-10-CM | POA: Diagnosis present

## 2022-09-21 LAB — URINALYSIS, ROUTINE W REFLEX MICROSCOPIC
Bilirubin Urine: NEGATIVE
Glucose, UA: NEGATIVE mg/dL
Hgb urine dipstick: NEGATIVE
Ketones, ur: NEGATIVE mg/dL
Leukocytes,Ua: NEGATIVE
Nitrite: NEGATIVE
Protein, ur: NEGATIVE mg/dL
Specific Gravity, Urine: 1.017 (ref 1.005–1.030)
pH: 6 (ref 5.0–8.0)

## 2022-09-21 LAB — POC URINE PREG, ED: Preg Test, Ur: NEGATIVE

## 2022-09-21 MED ORDER — ONDANSETRON 4 MG PO TBDP: 4.0000 mg | ORAL_TABLET | Freq: Three times a day (TID) | ORAL | 0 refills | Status: AC | PRN

## 2022-09-21 NOTE — Discharge Instructions (Addendum)
Your exam and labs are normal at this time. You CT scan does not show any kidney stones. No evidence of a UTI. Follow-up with your primary care provider or Urology as discussed.

## 2022-09-21 NOTE — ED Notes (Signed)
Pt is refusing blood draw at this time. Pt states she only needs a UA. RN educated about labs but she would like to wait until she sees MD.

## 2022-09-21 NOTE — ED Triage Notes (Signed)
Pt to ED from home for possible kidney stone. Pt has been having frequency in urination and sharp stabbing pain in lower back. Denies blood in urine. Pt has HX of kidney stones. Pt last stone felt the same. Pt had to have lithotripsy last year due to stone too large to pass. Pt is CAOx4 and in no acute distress.

## 2022-09-21 NOTE — ED Provider Notes (Signed)
[facility] Emergency Department Provider Note     Event Date/Time   First MD Initiated Contact with Patient 09/21/22 1714     (approximate)   History   Flank Pain (Kidney stone (HX))   HPI  Kayla Weber is a 22 y.o. female presents to the ED for evaluation of flank pain, urinary frequency and LBP. She denies FCS, NVD, hematuria. She has a history of kidney stones.    Physical Exam   Triage Vital Signs: ED Triage Vitals  Enc Vitals Group     BP 09/21/22 1623 116/74     Pulse Rate 09/21/22 1623 (!) 114     Resp 09/21/22 1623 16     Temp 09/21/22 1623 98.4 F (36.9 C)     Temp Source 09/21/22 1623 Oral     SpO2 09/21/22 1623 98 %     Weight 09/21/22 1620 150 lb (68 kg)     Height 09/21/22 1620 5\' 2"  (1.575 m)     Head Circumference --      Peak Flow --      Pain Score 09/21/22 1620 0     Pain Loc --      Pain Edu? --      Excl. in GC? --     Most recent vital signs: Vitals:   09/21/22 1623  BP: 116/74  Pulse: (!) 114  Resp: 16  Temp: 98.4 F (36.9 C)  SpO2: 98%    General Awake, no distress. NAD CV:  Good peripheral perfusion.  RESP:  Normal effort.  ABD:  No distention. Mild left flank/LBP   ED Results / Procedures / Treatments   Labs (all labs ordered are listed, but only abnormal results are displayed) Labs Reviewed  URINALYSIS, ROUTINE W REFLEX MICROSCOPIC - Abnormal; Notable for the following components:      Result Value   Color, Urine YELLOW (*)    APPearance HAZY (*)    All other components within normal limits  BASIC METABOLIC PANEL  CBC  POC URINE PREG, ED     EKG    RADIOLOGY  I personally viewed and evaluated these images as part of my medical decision making, as well as reviewing the written report by the radiologist.  ED Provider Interpretation: no acute findings  CT Renal Stone Study  Result Date: 09/21/2022 CLINICAL DATA:  Abdominal/flank pain, stone suspected EXAM: CT ABDOMEN AND PELVIS  WITHOUT CONTRAST TECHNIQUE: Multidetector CT imaging of the abdomen and pelvis was performed following the standard protocol without IV contrast. RADIATION DOSE REDUCTION: This exam was performed according to the departmental dose-optimization program which includes automated exposure control, adjustment of the mA and/or kV according to patient size and/or use of iterative reconstruction technique. COMPARISON:  None Available. FINDINGS: Lower chest: No acute abnormality. Hepatobiliary: No focal liver abnormality. No gallstones, gallbladder wall thickening, or pericholecystic fluid. No biliary dilatation. Pancreas: No focal lesion. Normal pancreatic contour. No surrounding inflammatory changes. No main pancreatic ductal dilatation. Spleen: Normal in size without focal abnormality. Adrenals/Urinary Tract: No adrenal nodule bilaterally. No nephrolithiasis and no hydronephrosis. No definite contour-deforming renal mass. No ureterolithiasis or hydroureter. The urinary bladder is unremarkable. Stomach/Bowel: Stomach is within normal limits. No evidence of bowel wall thickening or dilatation. Status post appendectomy. Vascular/Lymphatic: No abdominal aorta or iliac aneurysm. Mild atherosclerotic plaque of the aorta and its branches. No abdominal, pelvic, or inguinal lymphadenopathy. Reproductive: T-shaped intrauterine device in appropriate position. Uterus and bilateral adnexa are unremarkable. Other: No  intraperitoneal free fluid. No intraperitoneal free gas. No organized fluid collection. Musculoskeletal: No abdominal wall hernia or abnormality. No suspicious lytic or blastic osseous lesions. No acute displaced fracture. IMPRESSION: 1. No acute intra-abdominal or intrapelvic abnormality with limited evaluation on this noncontrast study. 2. T-shaped intrauterine device in appropriate position. Electronically Signed   By: Iven Finn M.D.   On: 09/21/2022 17:45     PROCEDURES:  Critical Care performed:  No  Procedures   MEDICATIONS ORDERED IN ED: Medications - No data to display   IMPRESSION / MDM / [place] / ED COURSE  I reviewed the triage vital signs and the nursing notes.                              Differential diagnosis includes, but is not limited to, ovarian cyst, ovarian torsion, acute appendicitis, diverticulitis, urinary tract infection/pyelonephritis, endometriosis, bowel obstruction, colitis, renal colic, gastroenteritis, hernia, fibroids, endometriosis, pregnancy related pain including ectopic pregnancy, etc.   Patient's presentation is most consistent with acute complicated illness / injury requiring diagnostic workup.  Patient's diagnosis is consistent with dysuria and flank pain, without evidence of an acute cystitis or bacteriuria.  Patient with a normal CT scan based on my interpretation of images, without evidence of hydronephrosis, renal calculi, or pyelonephritis.  Patient will be discharged home with prescriptions for ondansetron. Patient is to follow up with urology as needed or otherwise directed. Patient is given ED precautions to return to the ED for any worsening or new symptoms.     FINAL CLINICAL IMPRESSION(S) / ED DIAGNOSES   Final diagnoses:  Flank pain     Rx / DC Orders   ED Discharge Orders          Ordered    ondansetron (ZOFRAN-ODT) 4 MG disintegrating tablet  Every 8 hours PRN        09/21/22 1756             Note:  This document was prepared using Dragon voice recognition software and may include unintentional dictation errors.    Melvenia Needles, PA-C 09/21/22 2153    Lavonia Drafts, MD 09/21/22 2205

## 2023-02-28 IMAGING — CT CT CERVICAL SPINE W/O CM
3 of 4 series · 9 of 33 positions shown, 11 images · non-contrast
Comparison: None Available.

CLINICAL DATA: Head and neck trauma, possible seizure, syncopal
event



[Series 6: orthogonal bone · axial · 0.23mm/px · z∈[-206,-206]mm · 1 of 112 slices shown, 2 images]
[im 64/112  soft-tissue]
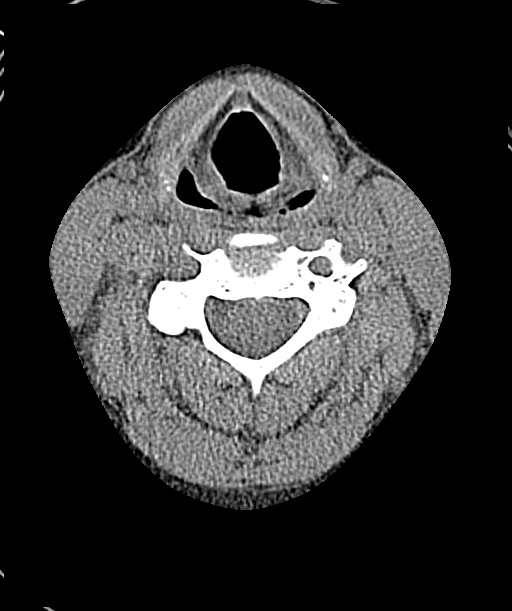
[im 64/112  bone]
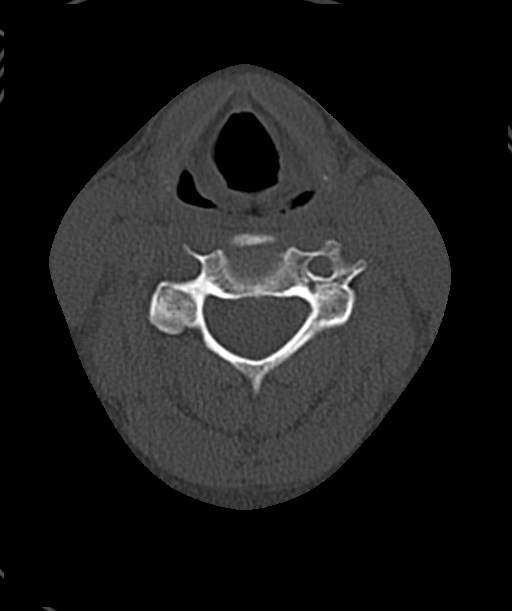

[Series 7: sagittal bone · sagittal · 0.23mm/px · 5 of 59 slices shown, 6 images]
[im 20/59  bone]
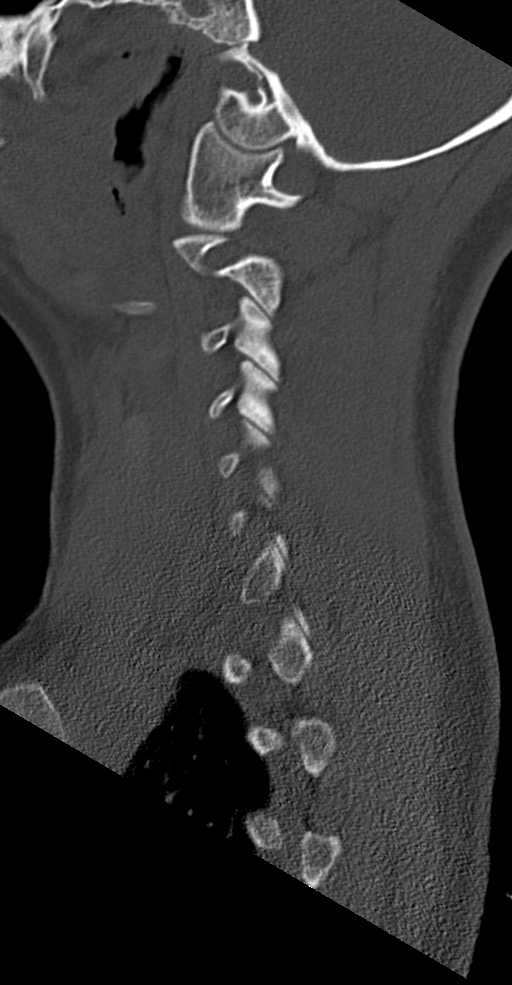
[im 25/59  bone]
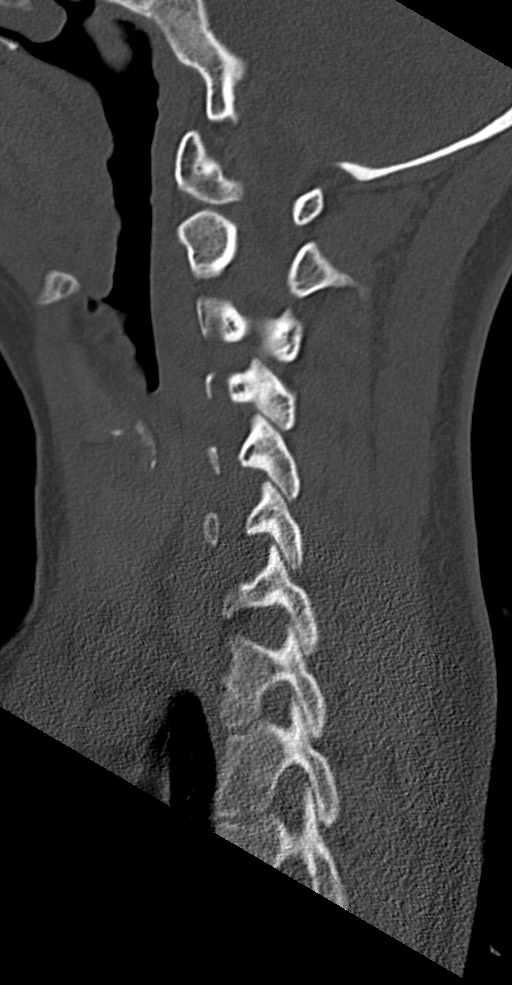
[im 30/59  soft-tissue]
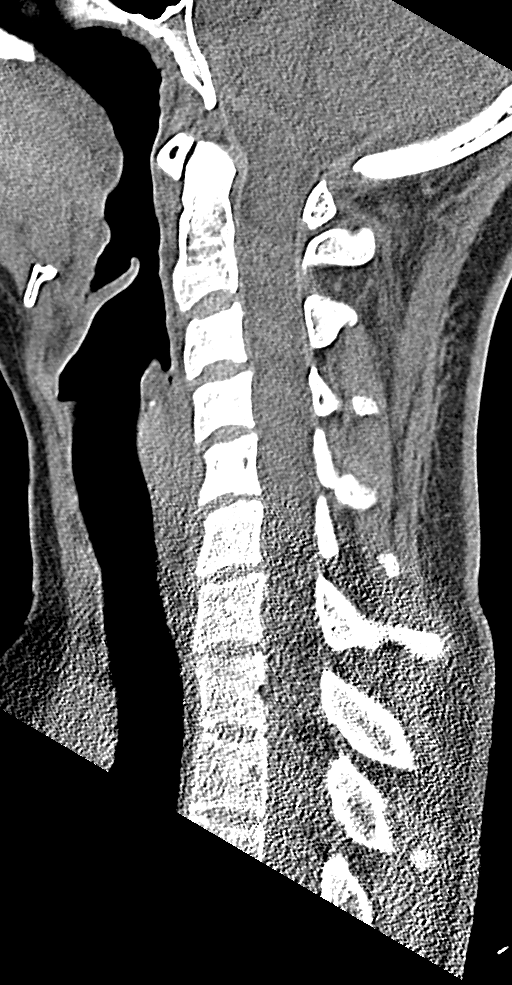
[im 30/59  bone]
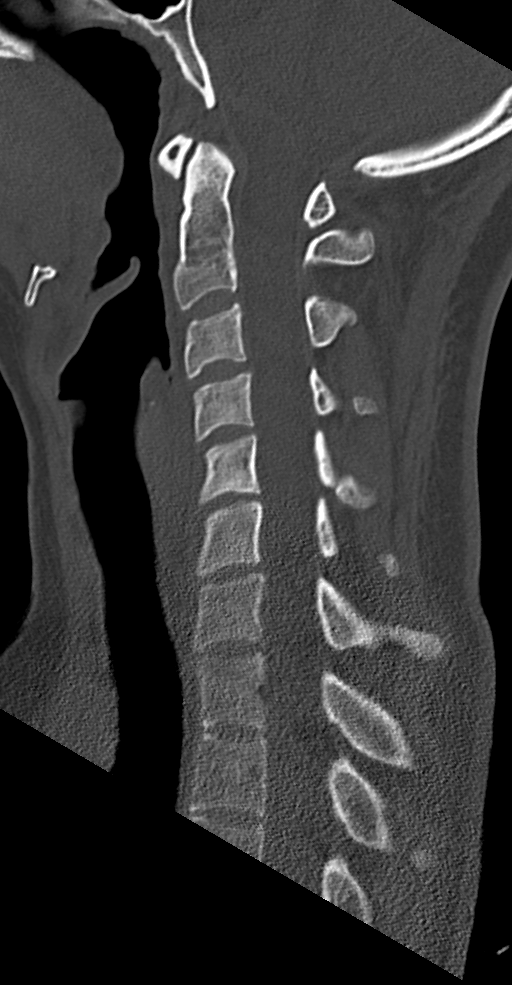
[im 34/59  bone]
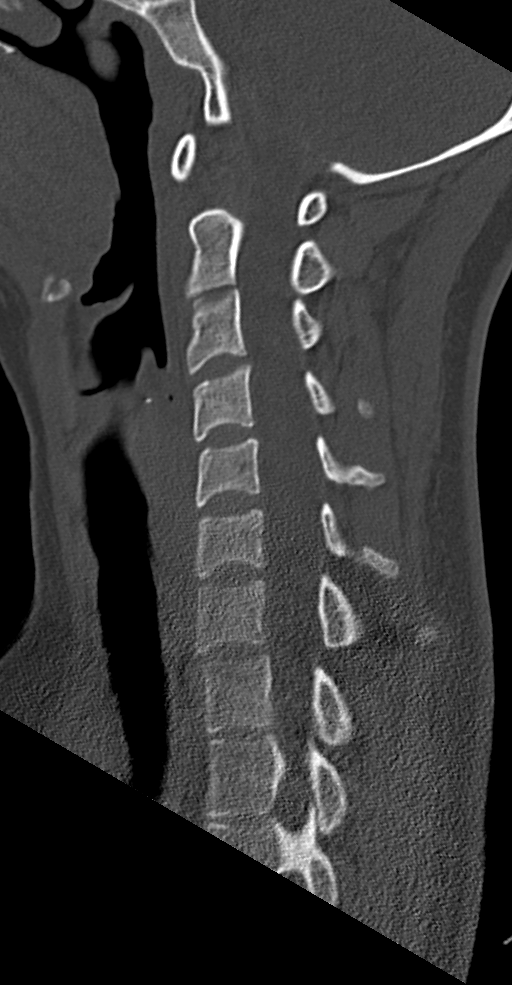
[im 39/59  bone]
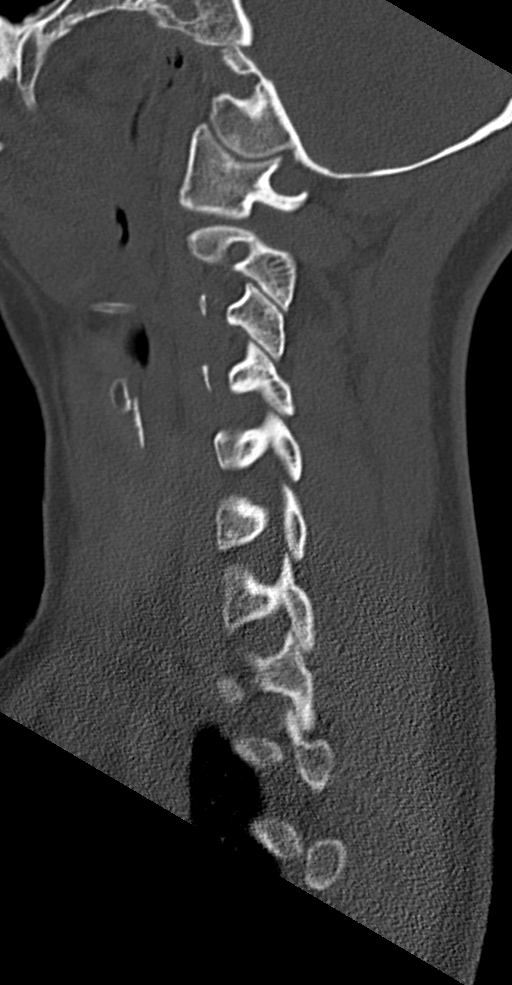

[Series 8: coronal bone · coronal · 0.21mm/px · 3 of 60 slices shown]
[im 16/60  bone]
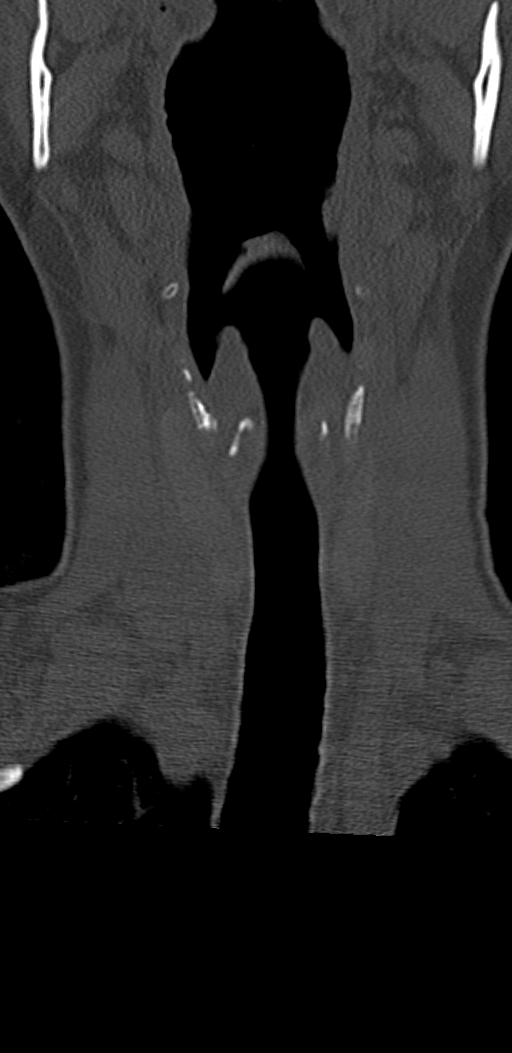
[im 25/60  bone]
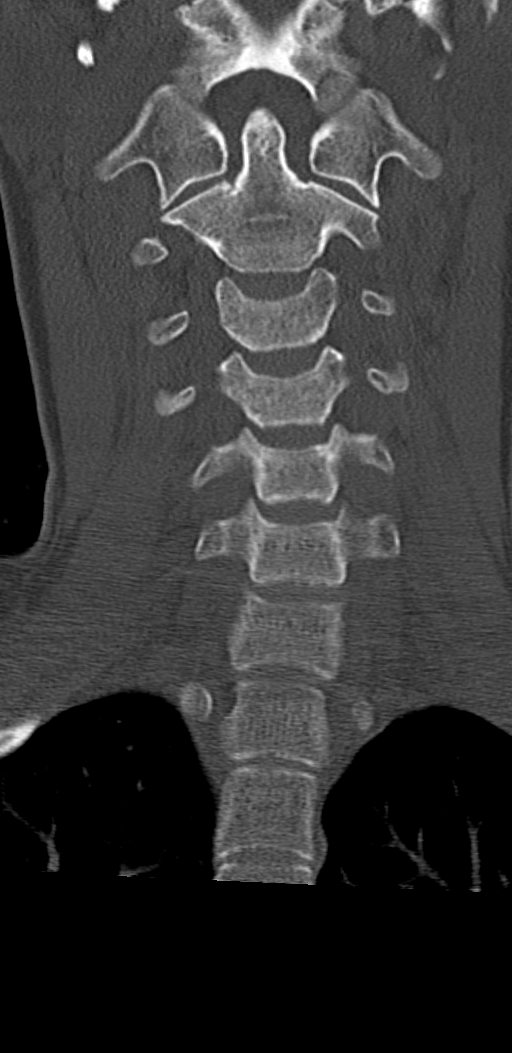
[im 35/60  bone]
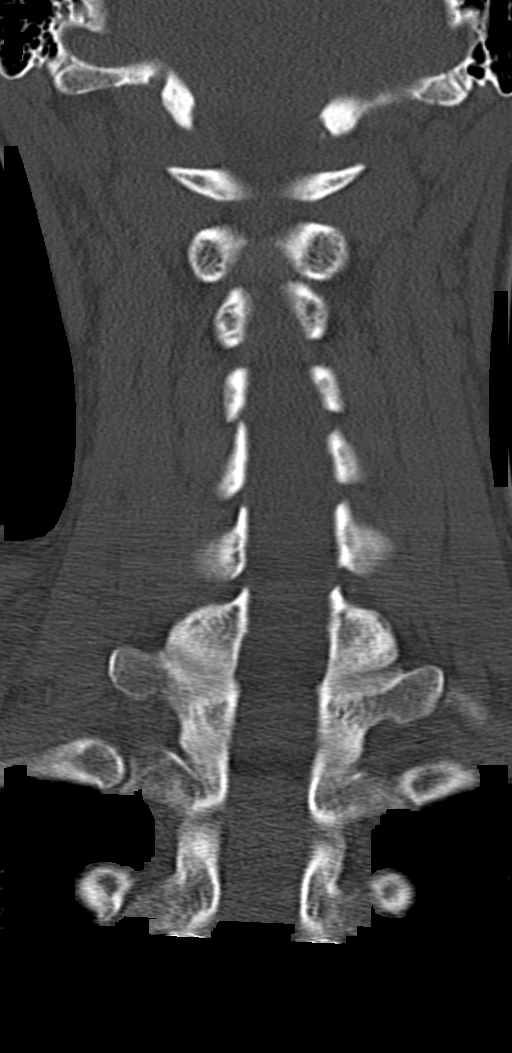

[9 of 33 positions shown; findings below may reference images not displayed]

FINDINGS: CT HEAD FINDINGS

Brain: No evidence of acute infarction, hemorrhage, hydrocephalus,
extra-axial collection or mass lesion/mass effect.

Vascular: No hyperdense vessel or unexpected calcification.

Skull: Normal. Negative for fracture or focal lesion.

Sinuses/Orbits: No acute finding.

Other: Soft tissue contusion over the left temple (series 2, image
14).

CT CERVICAL SPINE FINDINGS

Alignment: Normal.

Skull base and vertebrae: No acute fracture. No primary bone lesion
or focal pathologic process.

Soft tissues and spinal canal: No prevertebral fluid or swelling. No
visible canal hematoma.

Disc levels:  Intact.

Upper chest: Negative.

Other: None.
IMPRESSION: 1. No acute intracranial pathology.
2. No fracture or static subluxation of the cervical spine.
3. Soft tissue contusion over the left temple.

## 2023-06-30 ENCOUNTER — Ambulatory Visit: Payer: Medicaid Other | Admitting: Advanced Practice Midwife

## 2024-02-29 ENCOUNTER — Emergency Department
Admission: EM | Admit: 2024-02-29 | Discharge: 2024-02-29 | Disposition: A | Discharge status: Home / Self Care | Payer: MEDICAID | Attending: Emergency Medicine | Admitting: Emergency Medicine

## 2024-02-29 ENCOUNTER — Other Ambulatory Visit: Payer: Self-pay

## 2024-02-29 DIAGNOSIS — T7840XA Allergy, unspecified, initial encounter: Secondary | ICD-10-CM | POA: Diagnosis present

## 2024-02-29 DIAGNOSIS — N3000 Acute cystitis without hematuria: Secondary | ICD-10-CM | POA: Insufficient documentation

## 2024-02-29 MED ORDER — CEPHALEXIN 500 MG PO CAPS: 500.0000 mg | ORAL_CAPSULE | Freq: Four times a day (QID) | ORAL | 0 refills | Status: AC

## 2024-02-29 NOTE — ED Triage Notes (Addendum)
 PT arrives via POV with c/o an allergic reaction. Pt started a new ABX this morning and about 30 mins after taking the medication they started itching on their arms, legs, back and upper chest. Pt denies, SOB, CP, trouble swallowing. Pt is A&Ox4 and ambulatory in triage. Pt taking ABX for a UTI.  Medication is nitrofurantoin 

## 2024-02-29 NOTE — ED Provider Triage Note (Signed)
 Emergency Medicine Provider Triage Evaluation Note  Kayla Weber , a 23 y.o. female  was evaluated in triage.  Pt complains of rash, itchiness after taking this morning nitrofurantoin .  States she was seen yesterday at urgent care diagnosed with UTI and today started taking the nitrofurantoin .  Symptoms started 30 minutes after taking the medication.  Patient states having itchiness in bilateral forearms, legs, back .  Patient denies difficulty breathing, palpebral edema, wheezing, shortness of breath. Patient did not take any Benadryl  at home, patient is not driving.  Review of Systems  Positive: Negative:   Physical Exam  There were no vitals taken for this visit.  Patient is scratching her arms Gen:   Awake, no distress   Resp:  Normal effort  MSK:   Moves extremities without difficulty  Other:  Extremities: Rash noted bilateral anterior forearm ,  Medical Decision Making  Medically screening exam initiated at 5:22 PM.  Appropriate orders placed.  Kayla Weber was informed that the remainder of the evaluation will be completed by another provider, this initial triage assessment does not replace that evaluation, and the importance of remaining in the ED until their evaluation is complete.  Patient with allergic reaction secondary to nitrofurantoin  prescribed for UTI.  No difficulty breathing no wheezing no shortness of breath no periorbital edema no periorbital edema.   Awilda Lennox, PA-C 02/29/24 1725

## 2024-02-29 NOTE — ED Provider Notes (Signed)
 [facility] Provider Note    Event Date/Time   First MD Initiated Contact with Patient 02/29/24 1818     (approximate)   History   Chief Complaint Allergic Reaction   HPI  KEALANI Kayla Weber is a 23 y.o. female with past medical history of von Willebrand's disease who presents to the ED complaining of allergic reaction.  Patient reports that a couple hours prior to arrival she took a dose of nitrofurantoin  that she was prescribed for a UTI.  Shortly afterwards, she developed diffuse itchy rash.  She denies any associated difficulty breathing, nausea, vomiting, diarrhea, or lightheadedness.  Since arriving to the ED, she states that the rash has improved.  She continues to have dysuria but denies any fevers or flank pain.     Physical Exam   Triage Vital Signs: ED Triage Vitals  Encounter Vitals Group     BP 02/29/24 1725 130/77     Girls Systolic BP Percentile --      Girls Diastolic BP Percentile --      Boys Systolic BP Percentile --      Boys Diastolic BP Percentile --      Pulse Rate 02/29/24 1725 99     Resp 02/29/24 1725 16     Temp 02/29/24 1725 98.7 F (37.1 C)     Temp Source 02/29/24 1725 Oral     SpO2 02/29/24 1725 99 %     Weight 02/29/24 1723 178 lb (80.7 kg)     Height 02/29/24 1723 5' 4 (1.626 m)     Head Circumference --      Peak Flow --      Pain Score 02/29/24 1721 0     Pain Loc --      Pain Education --      Exclude from Growth Chart --     Most recent vital signs: Vitals:   02/29/24 1725  BP: 130/77  Pulse: 99  Resp: 16  Temp: 98.7 F (37.1 C)  SpO2: 99%    Constitutional: Alert and oriented. Eyes: Conjunctivae are normal. Head: Atraumatic. Nose: No congestion/rhinnorhea. Mouth/Throat: Mucous membranes are moist.  Cardiovascular: Normal rate, regular rhythm. Grossly normal heart sounds.  2+ radial pulses bilaterally. Respiratory: Normal respiratory effort.  No retractions. Lungs CTAB. Gastrointestinal: Soft  and nontender. No distention. Musculoskeletal: No lower extremity tenderness nor edema.  Neurologic:  Normal speech and language. No gross focal neurologic deficits are appreciated.    ED Results / Procedures / Treatments   Labs (all labs ordered are listed, but only abnormal results are displayed) Labs Reviewed - No data to display   PROCEDURES:  Critical Care performed: No  Procedures   MEDICATIONS ORDERED IN ED: Medications - No data to display   IMPRESSION / MDM / ASSESSMENT AND PLAN / ED COURSE  I reviewed the triage vital signs and the nursing notes.                              23 y.o. female with past medical history of von Willebrand's disease who presents to the ED complaining of diffuse itchy rash after taking antibiotics for UTI.  Patient's presentation is most consistent with acute, uncomplicated illness.  Differential diagnosis includes, but is not limited to, allergic reaction, anaphylaxis, cystitis.  Patient nontoxic-appearing and in no acute distress, vital signs are unremarkable.  She had hives earlier but these seem to be resolving, no symptoms  to suggest anaphylaxis.  She is appropriate for outpatient management and we will change her antibiotic to Keflex as she has previously tolerated cephalosporins.  She was counseled to return to the ED for new or worsening symptoms, patient agrees with plan.      FINAL CLINICAL IMPRESSION(S) / ED DIAGNOSES   Final diagnoses:  Allergic reaction, initial encounter  Acute cystitis without hematuria     Rx / DC Orders   ED Discharge Orders          Ordered    cephALEXin (KEFLEX) 500 MG capsule  4 times daily        02/29/24 1913             Note:  This document was prepared using Dragon voice recognition software and may include unintentional dictation errors.   Twilla Galea, MD 02/29/24 [phone]

## 2024-02-29 NOTE — ED Notes (Signed)
 EDP at bedside

## 2024-05-09 ENCOUNTER — Other Ambulatory Visit: Payer: Self-pay | Admitting: Family

## 2024-05-09 DIAGNOSIS — N939 Abnormal uterine and vaginal bleeding, unspecified: Secondary | ICD-10-CM

## 2024-05-10 ENCOUNTER — Other Ambulatory Visit: Payer: Self-pay | Admitting: Family

## 2024-05-10 DIAGNOSIS — N644 Mastodynia: Secondary | ICD-10-CM

## 2024-05-13 ENCOUNTER — Other Ambulatory Visit: Payer: Self-pay | Admitting: Family

## 2024-05-13 ENCOUNTER — Ambulatory Visit
Admission: RE | Admit: 2024-05-13 | Discharge: 2024-05-13 | Disposition: A | Discharge status: Home / Self Care | Payer: MEDICAID | Source: Ambulatory Visit | Attending: Family | Admitting: Family

## 2024-05-13 DIAGNOSIS — N644 Mastodynia: Secondary | ICD-10-CM

## 2024-05-17 ENCOUNTER — Ambulatory Visit
Admission: RE | Admit: 2024-05-17 | Discharge: 2024-05-17 | Disposition: A | Discharge status: Home / Self Care | Payer: MEDICAID | Source: Ambulatory Visit | Attending: Family | Admitting: Family

## 2024-05-17 DIAGNOSIS — N939 Abnormal uterine and vaginal bleeding, unspecified: Secondary | ICD-10-CM | POA: Insufficient documentation
